# Patient Record
Sex: Female | Born: 1950 | Race: Black or African American | Hispanic: No | Marital: Single | Smoking: Never smoker
Health system: Southern US, Community
[De-identification: ages and names within clinical notes are randomized; demographics above are authoritative.]

## PROBLEM LIST (undated history)

## (undated) DIAGNOSIS — E039 Hypothyroidism, unspecified: Secondary | ICD-10-CM

## (undated) DIAGNOSIS — E785 Hyperlipidemia, unspecified: Secondary | ICD-10-CM

## (undated) DIAGNOSIS — M109 Gout, unspecified: Secondary | ICD-10-CM

## (undated) DIAGNOSIS — M1990 Unspecified osteoarthritis, unspecified site: Secondary | ICD-10-CM

## (undated) DIAGNOSIS — I10 Essential (primary) hypertension: Secondary | ICD-10-CM

## (undated) DIAGNOSIS — E119 Type 2 diabetes mellitus without complications: Secondary | ICD-10-CM

## (undated) HISTORY — PX: BACK SURGERY: SHX140

## (undated) HISTORY — PX: JOINT REPLACEMENT: SHX530

## (undated) HISTORY — PX: TONSILLECTOMY: SUR1361

## (undated) HISTORY — PX: ABDOMINAL HYSTERECTOMY: SHX81

---

## 2010-06-18 ENCOUNTER — Emergency Department (HOSPITAL_BASED_OUTPATIENT_CLINIC_OR_DEPARTMENT_OTHER)
Admission: EM | Admit: 2010-06-18 | Discharge: 2010-06-18 | Payer: Self-pay | Source: Home / Self Care | Admitting: Emergency Medicine

## 2013-02-11 ENCOUNTER — Encounter (HOSPITAL_BASED_OUTPATIENT_CLINIC_OR_DEPARTMENT_OTHER): Payer: Self-pay | Admitting: *Deleted

## 2013-02-11 ENCOUNTER — Emergency Department (HOSPITAL_BASED_OUTPATIENT_CLINIC_OR_DEPARTMENT_OTHER)
Admission: EM | Admit: 2013-02-11 | Discharge: 2013-02-11 | Disposition: A | Discharge status: Home / Self Care | Payer: BC Managed Care – PPO | Attending: Emergency Medicine | Admitting: Emergency Medicine

## 2013-02-11 ENCOUNTER — Emergency Department (HOSPITAL_BASED_OUTPATIENT_CLINIC_OR_DEPARTMENT_OTHER): Payer: BC Managed Care – PPO

## 2013-02-11 DIAGNOSIS — I10 Essential (primary) hypertension: Secondary | ICD-10-CM | POA: Insufficient documentation

## 2013-02-11 DIAGNOSIS — M10472 Other secondary gout, left ankle and foot: Secondary | ICD-10-CM

## 2013-02-11 DIAGNOSIS — E785 Hyperlipidemia, unspecified: Secondary | ICD-10-CM | POA: Insufficient documentation

## 2013-02-11 DIAGNOSIS — Z794 Long term (current) use of insulin: Secondary | ICD-10-CM | POA: Insufficient documentation

## 2013-02-11 DIAGNOSIS — M109 Gout, unspecified: Secondary | ICD-10-CM | POA: Insufficient documentation

## 2013-02-11 DIAGNOSIS — Z79899 Other long term (current) drug therapy: Secondary | ICD-10-CM | POA: Insufficient documentation

## 2013-02-11 DIAGNOSIS — E119 Type 2 diabetes mellitus without complications: Secondary | ICD-10-CM | POA: Insufficient documentation

## 2013-02-11 HISTORY — DX: Type 2 diabetes mellitus without complications: E11.9

## 2013-02-11 HISTORY — DX: Hyperlipidemia, unspecified: E78.5

## 2013-02-11 HISTORY — DX: Essential (primary) hypertension: I10

## 2013-02-11 HISTORY — DX: Gout, unspecified: M10.9

## 2013-02-11 MED ORDER — HYDROCODONE-ACETAMINOPHEN 5-325 MG PO TABS: 2.0000 | ORAL_TABLET | ORAL | Status: DC | PRN

## 2013-02-11 MED ORDER — INDOMETHACIN 25 MG PO CAPS: 25.0000 mg | ORAL_CAPSULE | Freq: Three times a day (TID) | ORAL | Status: DC | PRN

## 2013-02-11 MED ORDER — HYDROCODONE-ACETAMINOPHEN 5-325 MG PO TABS
2.0000 | ORAL_TABLET | Freq: Once | ORAL | Status: AC
  Administered 2013-02-11: 2 via ORAL
  Filled 2013-02-11: qty 2

## 2013-02-11 NOTE — ED Notes (Signed)
Pt c/o left foot pain x 3 days HX of gout

## 2013-02-11 NOTE — ED Notes (Signed)
Pt returned from xray

## 2013-02-11 NOTE — ED Notes (Signed)
Patient transported to X-ray via WC. 

## 2013-02-11 NOTE — ED Provider Notes (Signed)
Medical screening examination/treatment/procedure(s) were performed by non-physician practitioner and as supervising physician I was immediately available for consultation/collaboration.  Judithe Keetch, MD 02/11/13 2220 

## 2013-02-11 NOTE — ED Provider Notes (Signed)
CSN: [id number]     Arrival date & time 02/11/13  1855 History   First MD Initiated Contact with Patient 02/11/13 1919     Chief Complaint  Patient presents with  . Foot Pain   (Consider location/radiation/quality/duration/timing/severity/associated sxs/prior Treatment) Patient is a 62 y.o. female presenting with ankle pain. The history is provided by the patient. No language interpreter was used.  Ankle Pain Location:  Ankle Time since incident:  3 days Injury: no   Ankle location:  L ankle Pain details:    Quality:  Aching   Radiates to:  Does not radiate   Severity:  Severe   Onset quality:  Gradual   Duration:  3 days   Timing:  Constant   Progression:  Worsening Dislocation: no   Foreign body present:  No foreign bodies Prior injury to area:  No Worsened by:  Nothing tried Ineffective treatments:  None tried Pt reports she has a history of gout.  Pt reports increasing pain.   Past Medical History  Diagnosis Date  . Gout   . Hypertension   . Diabetes mellitus without complication   . Hyperlipemia    Past Surgical History  Procedure Laterality Date  . Abdominal hysterectomy    . Tonsillectomy    . Back surgery    . Joint replacement     History reviewed. No pertinent family history. History  Substance Use Topics  . Smoking status: Never Smoker   . Smokeless tobacco: Not on file  . Alcohol Use: No   OB History   Grav Para Term Preterm Abortions TAB SAB Ect Mult Living                 Review of Systems  Musculoskeletal: Positive for myalgias and joint swelling.  All other systems reviewed and are negative.    Allergies  Aspirin  Home Medications   Current Outpatient Rx  Name  Route  Sig  Dispense  Refill  . amLODipine-valsartan (EXFORGE) 5-160 MG per tablet   Oral   Take 1 tablet by mouth daily.         . insulin glargine (LANTUS) 100 UNIT/ML injection   Subcutaneous   Inject 70 Units into the skin daily.         Marland Kitchen levothyroxine  (SYNTHROID, LEVOTHROID) 125 MCG tablet   Oral   Take 125 mcg by mouth daily before breakfast.         . metFORMIN (GLUCOPHAGE) 500 MG tablet   Oral   Take 500 mg by mouth 2 (two) times daily with a meal.         . metoprolol tartrate (LOPRESSOR) 25 MG tablet   Oral   Take 25 mg by mouth 2 (two) times daily.         . rosuvastatin (CRESTOR) 10 MG tablet   Oral   Take 10 mg by mouth daily.          BP 170/70  Pulse 86  Temp(Src) 98.7 F (37.1 C) (Oral)  Resp 18  Ht 5\' 6"  (1.676 m)  Wt 250 lb (113.399 kg)  BMI 40.37 kg/m2  SpO2 92% Physical Exam  Nursing note and vitals reviewed. Constitutional: She is oriented to person, place, and time. She appears well-developed and well-nourished.  HENT:  Head: Normocephalic.  Musculoskeletal: She exhibits tenderness.  Swollen tender ankle,  From painful,    Neurological: She is alert and oriented to person, place, and time. She has normal reflexes.  Skin: Skin is  warm.  Psychiatric: She has a normal mood and affect.    ED Course  Procedures (including critical care time) Labs Review Labs Reviewed - No data to display Imaging Review Dg Ankle Complete Left  02/11/2013   CLINICAL DATA:  Left ankle pain for 3 days, history of gout  EXAM: LEFT ANKLE COMPLETE - 3+ VIEW  COMPARISON:  None.  FINDINGS: Three views of left ankle submitted. No acute fracture or subluxation. There is soft tissue swelling adjacent to medial and lateral malleolus. Ankle mortise is preserved. Plantar spur of calcaneus is noted. Mild dorsal spurring anterior aspect of the talus. There is well corticated small calcification adjacent to distal tibia medial malleolus. This most likely is due to prior injury.  IMPRESSION: No acute fracture or subluxation. There is soft tissue swelling adjacent to medial and lateral malleolus. Ankle mortise is preserved. Plantar spur of calcaneus is noted. Mild dorsal spurring anterior aspect of the talus. There is well corticated  small calcification adjacent to distal tibia medial malleolus. This most likely is due to prior injury.   Electronically Signed   By: Natasha Mead   On: 02/11/2013 20:24    MDM   1. Other secondary gout, left ankle and foot    Pt given 2 hydrocodone here.   Rx for hydrocodone and indocin.  Pt advised to see her MD for recheck   Elson Areas, PA-C 02/11/13 2101

## 2017-09-15 ENCOUNTER — Encounter (HOSPITAL_BASED_OUTPATIENT_CLINIC_OR_DEPARTMENT_OTHER): Payer: Self-pay | Admitting: Emergency Medicine

## 2017-09-15 ENCOUNTER — Emergency Department (HOSPITAL_BASED_OUTPATIENT_CLINIC_OR_DEPARTMENT_OTHER)
Admission: EM | Admit: 2017-09-15 | Discharge: 2017-09-15 | Disposition: A | Discharge status: Home / Self Care | Payer: Medicare Other | Attending: Emergency Medicine | Admitting: Emergency Medicine

## 2017-09-15 ENCOUNTER — Other Ambulatory Visit: Payer: Self-pay

## 2017-09-15 DIAGNOSIS — R42 Dizziness and giddiness: Secondary | ICD-10-CM | POA: Diagnosis not present

## 2017-09-15 DIAGNOSIS — R61 Generalized hyperhidrosis: Secondary | ICD-10-CM | POA: Insufficient documentation

## 2017-09-15 DIAGNOSIS — K591 Functional diarrhea: Secondary | ICD-10-CM | POA: Insufficient documentation

## 2017-09-15 DIAGNOSIS — R6883 Chills (without fever): Secondary | ICD-10-CM | POA: Insufficient documentation

## 2017-09-15 DIAGNOSIS — I10 Essential (primary) hypertension: Secondary | ICD-10-CM | POA: Insufficient documentation

## 2017-09-15 DIAGNOSIS — E119 Type 2 diabetes mellitus without complications: Secondary | ICD-10-CM | POA: Diagnosis not present

## 2017-09-15 DIAGNOSIS — Z794 Long term (current) use of insulin: Secondary | ICD-10-CM | POA: Diagnosis not present

## 2017-09-15 DIAGNOSIS — Z79899 Other long term (current) drug therapy: Secondary | ICD-10-CM | POA: Diagnosis not present

## 2017-09-15 DIAGNOSIS — R197 Diarrhea, unspecified: Secondary | ICD-10-CM | POA: Diagnosis present

## 2017-09-15 LAB — COMPREHENSIVE METABOLIC PANEL
ALBUMIN: 3.5 g/dL (ref 3.5–5.0)
ALT: 13 U/L — ABNORMAL LOW (ref 14–54)
ANION GAP: 9 (ref 5–15)
AST: 21 U/L (ref 15–41)
Alkaline Phosphatase: 51 U/L (ref 38–126)
BUN: 22 mg/dL — ABNORMAL HIGH (ref 6–20)
CO2: 25 mmol/L (ref 22–32)
Calcium: 9.3 mg/dL (ref 8.9–10.3)
Chloride: 105 mmol/L (ref 101–111)
Creatinine, Ser: 0.86 mg/dL (ref 0.44–1.00)
GFR calc Af Amer: >60 mL/min (ref >60)
GFR calc non Af Amer: >60 mL/min (ref >60)
Glucose, Bld: 110 mg/dL — ABNORMAL HIGH (ref 65–99)
POTASSIUM: 3.9 mmol/L (ref 3.5–5.1)
Sodium: 139 mmol/L (ref 135–145)
Total Bilirubin: 0.4 mg/dL (ref 0.3–1.2)
Total Protein: 7 g/dL (ref 6.5–8.1)

## 2017-09-15 LAB — CBC WITH DIFFERENTIAL/PLATELET
Basophils Absolute: 0 10*3/uL (ref 0.0–0.1)
Basophils Relative: 0 %
Eosinophils Absolute: 0.4 10*3/uL (ref 0.0–0.7)
Eosinophils Relative: 4 %
HCT: 38.6 % (ref 36.0–46.0)
Hemoglobin: 12.8 g/dL (ref 12.0–15.0)
LYMPHS PCT: 20 %
Lymphs Abs: 2 10*3/uL (ref 0.7–4.0)
MCH: 30.4 pg (ref 26.0–34.0)
MCHC: 33.2 g/dL (ref 30.0–36.0)
MCV: 91.7 fL (ref 78.0–100.0)
MONO ABS: 0.9 10*3/uL (ref 0.1–1.0)
MONOS PCT: 9 %
NEUTROS ABS: 6.4 10*3/uL (ref 1.7–7.7)
Neutrophils Relative %: 67 %
Platelets: 292 10*3/uL (ref 150–400)
RBC: 4.21 MIL/uL (ref 3.87–5.11)
RDW: 14.5 % (ref 11.5–15.5)
WBC: 9.6 10*3/uL (ref 4.0–10.5)

## 2017-09-15 LAB — URINALYSIS, ROUTINE W REFLEX MICROSCOPIC
Bilirubin Urine: NEGATIVE
Glucose, UA: NEGATIVE mg/dL
Ketones, ur: 15 mg/dL — AB
LEUKOCYTES UA: NEGATIVE
Nitrite: NEGATIVE
PH: 5 (ref 5.0–8.0)
Protein, ur: NEGATIVE mg/dL
Specific Gravity, Urine: >1.03 — ABNORMAL HIGH (ref 1.005–1.030)

## 2017-09-15 LAB — LIPASE, BLOOD: Lipase: 28 U/L (ref 11–51)

## 2017-09-15 LAB — URINALYSIS, MICROSCOPIC (REFLEX)

## 2017-09-15 LAB — I-STAT CG4 LACTIC ACID, ED: Lactic Acid, Venous: 1.12 mmol/L (ref 0.5–1.9)

## 2017-09-15 LAB — CBG MONITORING, ED: Glucose-Capillary: 87 mg/dL (ref 65–99)

## 2017-09-15 MED ORDER — SODIUM CHLORIDE 0.9 % IV BOLUS
1000.0000 mL | Freq: Once | INTRAVENOUS | Status: AC
  Administered 2017-09-15: 1000 mL via INTRAVENOUS

## 2017-09-15 MED ORDER — LOPERAMIDE HCL 2 MG PO CAPS: 2.0000 mg | ORAL_CAPSULE | Freq: Four times a day (QID) | ORAL | 0 refills | Status: DC | PRN

## 2017-09-15 NOTE — Discharge Instructions (Signed)
° °  Hand washing: Wash your hands throughout the day, but especially before and after touching the face, using the restroom, sneezing, coughing, or touching surfaces that have been coughed or sneezed upon. Hydration: Symptoms will be intensified and complicated by dehydration. Dehydration can also extend the duration of symptoms. Drink plenty of fluids and get plenty of rest. You should be drinking at least half a liter of water an hour to stay hydrated. Electrolyte drinks (ex. Gatorade, Powerade, Pedialyte) are also encouraged. You should be drinking enough fluids to make your urine light yellow, almost clear. If this is not the case, you are not drinking enough water. Please note that some of the treatments indicated below will not be effective if you are not adequately hydrated. Diet: Please concentrate on hydration, however, you may introduce food slowly.  Start with a clear liquid diet, progressed to a full liquid diet, and then bland solids as you are able. Pain or fever: Ibuprofen, Naproxen, or Tylenol for pain or fever.  Diarrhea: May take Imodium, as prescribed, for diarrhea.  Avoid foods with dairy while having diarrhea. Follow up: Follow up with a primary care provider, as needed, for any future management of this issue. Return: Return to the ED should symptoms worsen or you develop fever, blood in the stool, abdominal pain, persistent vomiting, or any other major concerns.

## 2017-09-15 NOTE — ED Triage Notes (Signed)
Patient states that she has had adbominal pain, chills and diarrhea for the last 2 days  - today she started to feel weak all over and dizzy

## 2017-09-15 NOTE — ED Notes (Signed)
Pt given rx x 1 for imodium at d/c. D/c home with her son driving

## 2017-09-15 NOTE — ED Notes (Signed)
Pt's CBG 87 reported to [place], [place]. Pt given OJ with verbal approval

## 2017-09-15 NOTE — ED Provider Notes (Signed)
[facility] EMERGENCY DEPARTMENT Provider Note   CSN: [id number] Arrival date & time: 09/15/17  1422     History   Chief Complaint Chief Complaint  Patient presents with  . Diarrhea    HPI Gina Rivas is a 67 y.o. female.  HPI   Gina Rivas is a 67 y.o. female, with a history of DM, hyperlipidemia, and HTN, presenting to the ED with diarrhea for last two days. Endorses stools throughout the day. States, "Too many instances to count." Accompanied by chills, sweats, and lightheadedness.  Last BM prior to arrival. No recent ABX. No recent hospitalizations. No known sick contacts.  Denies known fever, vomiting, hematochezia/melena, abdominal pain, shortness of breath, chest pain, or any other complaints.     Past Medical History:  Diagnosis Date  . Diabetes mellitus without complication (HCC)   . Gout   . Hyperlipemia   . Hypertension     There are no active problems to display for this patient.   Past Surgical History:  Procedure Laterality Date  . ABDOMINAL HYSTERECTOMY    . BACK SURGERY    . JOINT REPLACEMENT    . TONSILLECTOMY       OB History   None      Home Medications    Prior to Admission medications   Medication Sig Start Date End Date Taking? Authorizing Provider  amLODipine-valsartan (EXFORGE) 5-160 MG per tablet Take 1 tablet by mouth daily.   Yes [provider]  insulin glargine (LANTUS) 100 UNIT/ML injection Inject 70 Units into the skin daily.   Yes [provider]  levothyroxine (SYNTHROID, LEVOTHROID) 125 MCG tablet Take 125 mcg by mouth daily before breakfast.   Yes [provider]  metFORMIN (GLUCOPHAGE) 500 MG tablet Take 500 mg by mouth 2 (two) times daily with a meal.   Yes [provider]  metoprolol tartrate (LOPRESSOR) 25 MG tablet Take 25 mg by mouth 2 (two) times daily.   Yes [provider]  rosuvastatin (CRESTOR) 10 MG tablet Take 10 mg by mouth daily.   Yes  [provider]  HYDROcodone-acetaminophen (NORCO/VICODIN) 5-325 MG per tablet Take 2 tablets by mouth every 4 (four) hours as needed. 02/11/13   Elson Areas, PA-C  indomethacin (INDOCIN) 25 MG capsule Take 1 capsule (25 mg total) by mouth 3 (three) times daily as needed. 02/11/13   Elson Areas, PA-C  loperamide (IMODIUM) 2 MG capsule Take 1 capsule (2 mg total) by mouth 4 (four) times daily as needed for diarrhea or loose stools. 09/15/17   Anselm Pancoast, PA-C    Family History History reviewed. No pertinent family history.  Social History Social History   Tobacco Use  . Smoking status: Never Smoker  . Smokeless tobacco: Never Used  Substance Use Topics  . Alcohol use: No  . Drug use: No     Allergies   Aspirin   Review of Systems Review of Systems  Constitutional: Negative for chills, diaphoresis and fever.  Respiratory: Negative for shortness of breath.   Cardiovascular: Negative for chest pain.  Gastrointestinal: Positive for diarrhea. Negative for abdominal pain, blood in stool, nausea and vomiting.  Genitourinary: Negative for dysuria, flank pain, frequency and hematuria.  Musculoskeletal: Negative for back pain.  Skin: Negative for color change.  All other systems reviewed and are negative.    Physical Exam Updated Vital Signs BP 114/72 (BP Location: Right Arm)   Pulse 86   Temp 98.3 F (36.8 C) (Oral)  Resp 16   Ht  (1.676 m)   Wt 104.3 kg (230 lb)   SpO2 96%   BMI 37.12 kg/m   Physical Exam  Constitutional: She appears well-developed and well-nourished. No distress.  HENT:  Head: Normocephalic and atraumatic.  Eyes: Conjunctivae are normal.  Neck: Neck supple.  Cardiovascular: Normal rate, regular rhythm, normal heart sounds and intact distal pulses.  Pulmonary/Chest: Effort normal and breath sounds normal. No respiratory distress.  Abdominal: Soft. Bowel sounds are increased. There is no tenderness. There is no guarding.    Musculoskeletal: She exhibits no edema.  Lymphadenopathy:    She has no cervical adenopathy.  Neurological: She is alert.  Skin: Skin is warm and dry. She is not diaphoretic.  Psychiatric: She has a normal mood and affect. Her behavior is normal.  Nursing note and vitals reviewed.    ED Treatments / Results  Labs (all labs ordered are listed, but only abnormal results are displayed) Labs Reviewed  COMPREHENSIVE METABOLIC PANEL - Abnormal; Notable for the following components:      Result Value   Glucose, Bld 110 (*)    BUN 22 (*)    ALT 13 (*)    All other components within normal limits  URINALYSIS, ROUTINE W REFLEX MICROSCOPIC - Abnormal; Notable for the following components:   Specific Gravity, Urine >1.030 (*)    Hgb urine dipstick TRACE (*)    Ketones, ur 15 (*)    All other components within normal limits  URINALYSIS, MICROSCOPIC (REFLEX) - Abnormal; Notable for the following components:   Bacteria, UA MANY (*)    All other components within normal limits  CBC WITH DIFFERENTIAL/PLATELET  LIPASE, BLOOD  I-STAT CG4 LACTIC ACID, ED  CBG MONITORING, ED    EKG None  Radiology No results found.  Procedures Procedures (including critical care time)  Medications Ordered in ED Medications  sodium chloride 0.9 % bolus 1,000 mL (0 mLs Intravenous Stopped 09/15/17 1951)     Initial Impression / Assessment and Plan / ED Course  I have reviewed the triage vital signs and the nursing notes.  Pertinent labs & imaging results that were available during my care of the patient were reviewed by me and considered in my medical decision making (see chart for details).  Clinical Course as of Sep 17 115  Sun Sep 15, 2017  9604 Patient reassessed.  Appears to be resting comfortably.  Continues to deny pain or other current complaints.   [SJ]    Clinical Course User Index [SJ] Nader Boys C, PA-C    Patient presents with diarrhea for the last 2 days.  Abdominal exam  benign. Patient is nontoxic appearing, afebrile, not tachycardic, not tachypneic, not hypotensive, maintains adequate SPO2 on room air, and is in no apparent distress.  Lab results reassuring. No bowel movements during ED course.  PCP follow-up as needed. The patient was given instructions for home care as well as return precautions. Patient voices understanding of these instructions, accepts the plan, and is comfortable with discharge.    Findings and plan of care discussed with Doug Sou, MD. Dr. Ethelda Chick personally evaluated and examined this patient.  Vitals:   09/15/17 1441 09/15/17 1716 09/15/17 1925  BP: 114/72 115/66 116/62  Pulse: 86 68 72  Resp: Temp: 98.3 F (36.8 C)    TempSrc: Oral    SpO2: 96% 96% 96%  Weight: 104.3 kg (230 lb)    Height:  (1.676 m)  Final Clinical Impressions(s) / ED Diagnoses   Final diagnoses:  Functional diarrhea    ED Discharge Orders        Ordered    loperamide (IMODIUM) 2 MG capsule  4 times daily PRN     09/15/17 1937       Anselm Pancoast, PA-C 09/16/17 0118    Doug Sou, MD 09/16/17 1225

## 2017-09-15 NOTE — ED Provider Notes (Signed)
She complains of diarrhea and generalized weakness onset 3 days ago.  No fever no abdominal pain no vomiting lightheadedness worse with standing.  On exam she is in no distress lungs clear to auscultation heart regular rate and rhythm abdomen obese, normal active bowel sounds, nontender   Doug Sou, MD 09/15/17 [phone]

## 2019-09-16 NOTE — Progress Notes (Signed)
Need orders in epic .  Surgery on 09/23/19.

## 2019-09-16 NOTE — Progress Notes (Signed)
DUE TO COVID-19 ONLY ONE VISITOR IS ALLOWED TO COME WITH YOU AND STAY IN THE WAITING ROOM ONLY DURING PRE OP AND PROCEDURE DAY OF SURGERY. THE 1 VISITOR MAY VISIT WITH YOU AFTER SURGERY IN YOUR PRIVATE ROOM DURING VISITING HOURS ONLY!  YOU NEED TO HAVE A COVID 19 TEST ON_5/1/21 ______ @__1100am_____ , THIS TEST MUST BE DONE BEFORE SURGERY, COME  [address], [place] , .  [facility]) ONCE YOUR COVID TEST IS COMPLETED, PLEASE BEGIN THE QUARANTINE INSTRUCTIONS AS OUTLINED IN YOUR HANDOUT.                Gina Rivas  09/16/2019   Your procedure is scheduled on:  09/23/2019   Report to [facility] Main  Entrance   Report to admitting at    0930 AM     Call this number if you have problems the morning of surgery [phone]    Remember: Do not eat food or drink liquids :After Midnight. BRUSH YOUR TEETH MORNING OF SURGERY AND RINSE YOUR MOUTH OUT, NO CHEWING GUM CANDY OR MINTS.     Take these medicines the morning of surgery with A SIP OF WATER:  Amlodipine, Labetalol, Synthroid  DO NOT TAKE ANY DIABETIC MEDICATIONS DAY OF YOUR SURGERY    Take 1/2 dose of Levemir insulin  nite before surgery                            You may not have any metal on your body including hair pins and              piercings  Do not wear jewelry, make-up, lotions, powders or perfumes, deodorant             Do not wear nail polish on your fingernails.  Do not shave  48 hours prior to surgery.              Do not bring valuables to the hospital. [place] IS NOT             RESPONSIBLE   FOR VALUABLES.  Contacts, dentures or bridgework may not be worn into surgery.  Leave suitcase in the car. After surgery it may be brought to your room.     Patients discharged the day of surgery will not be allowed to drive home. IF YOU ARE HAVING SURGERY AND GOING HOME THE SAME DAY, YOU MUST HAVE AN ADULT TO DRIVE YOU HOME AND BE WITH YOU FOR 24 HOURS. YOU MAY GO HOME BY TAXI OR  UBER OR ORTHERWISE, BUT AN ADULT MUST ACCOMPANY YOU HOME AND STAY WITH YOU FOR 24 HOURS.  Name and phone number of your driver:  Special Instructions: N/A              Please read over the following fact sheets you were given: _____________________________________________________________________             [facility] - Preparing for Surgery Before surgery, you can play an important role.  Because skin is not sterile, your skin needs to be as free of germs as possible.  You can reduce the number of germs on your skin by washing with CHG (chlorahexidine gluconate) soap before surgery.  CHG is an antiseptic cleaner which kills germs and bonds with the skin to continue killing germs even after washing. Please DO NOT use if you have an allergy to CHG or antibacterial soaps.  If your  skin becomes reddened/irritated stop using the CHG and inform your nurse when you arrive at Short Stay. Do not shave (including legs and underarms) for at least 48 hours prior to the first CHG shower.  You may shave your face/neck. Please follow these instructions carefully:  1.  Shower with CHG Soap the night before surgery and the  morning of Surgery.  2.  If you choose to wash your hair, wash your hair first as usual with your  normal  shampoo.  3.  After you shampoo, rinse your hair and body thoroughly to remove the  shampoo.                           4.  Use CHG as you would any other liquid soap.  You can apply chg directly  to the skin and wash                       Gently with a scrungie or clean washcloth.  5.  Apply the CHG Soap to your body ONLY FROM THE NECK DOWN.   Do not use on face/ open                           Wound or open sores. Avoid contact with eyes, ears mouth and genitals (private parts).                       Wash face,  Genitals (private parts) with your normal soap.             6.  Wash thoroughly, paying special attention to the area where your surgery  will be performed.  7.  Thoroughly  rinse your body with warm water from the neck down.  8.  DO NOT shower/wash with your normal soap after using and rinsing off  the CHG Soap.                9.  Pat yourself dry with a clean towel.            10.  Wear clean pajamas.            11.  Place clean sheets on your bed the night of your first shower and do not  sleep with pets. Day of Surgery : Do not apply any lotions/deodorants the morning of surgery.  Please wear clean clothes to the hospital/surgery center.  FAILURE TO FOLLOW THESE INSTRUCTIONS MAY RESULT IN THE CANCELLATION OF YOUR SURGERY PATIENT SIGNATURE_________________________________  NURSE SIGNATURE__________________________________  ________________________________________________________________________

## 2019-09-17 ENCOUNTER — Other Ambulatory Visit: Payer: Self-pay

## 2019-09-17 ENCOUNTER — Encounter (HOSPITAL_COMMUNITY)
Admission: RE | Admit: 2019-09-17 | Discharge: 2019-09-17 | Disposition: A | Discharge status: Home / Self Care | Payer: Medicare PPO | Source: Ambulatory Visit | Attending: Orthopedic Surgery | Admitting: Orthopedic Surgery

## 2019-09-17 ENCOUNTER — Encounter (HOSPITAL_COMMUNITY): Payer: Self-pay

## 2019-09-17 ENCOUNTER — Ambulatory Visit: Payer: Self-pay | Admitting: Orthopedic Surgery

## 2019-09-17 DIAGNOSIS — E785 Hyperlipidemia, unspecified: Secondary | ICD-10-CM | POA: Insufficient documentation

## 2019-09-17 DIAGNOSIS — E119 Type 2 diabetes mellitus without complications: Secondary | ICD-10-CM | POA: Insufficient documentation

## 2019-09-17 DIAGNOSIS — Z79899 Other long term (current) drug therapy: Secondary | ICD-10-CM | POA: Diagnosis not present

## 2019-09-17 DIAGNOSIS — I10 Essential (primary) hypertension: Secondary | ICD-10-CM | POA: Diagnosis not present

## 2019-09-17 DIAGNOSIS — Z01818 Encounter for other preprocedural examination: Secondary | ICD-10-CM | POA: Insufficient documentation

## 2019-09-17 DIAGNOSIS — R9431 Abnormal electrocardiogram [ECG] [EKG]: Secondary | ICD-10-CM | POA: Diagnosis not present

## 2019-09-17 DIAGNOSIS — Z79890 Hormone replacement therapy (postmenopausal): Secondary | ICD-10-CM | POA: Insufficient documentation

## 2019-09-17 DIAGNOSIS — E039 Hypothyroidism, unspecified: Secondary | ICD-10-CM | POA: Insufficient documentation

## 2019-09-17 DIAGNOSIS — M1612 Unilateral primary osteoarthritis, left hip: Secondary | ICD-10-CM | POA: Diagnosis not present

## 2019-09-17 DIAGNOSIS — Z794 Long term (current) use of insulin: Secondary | ICD-10-CM | POA: Insufficient documentation

## 2019-09-17 HISTORY — DX: Unspecified osteoarthritis, unspecified site: M19.90

## 2019-09-17 HISTORY — DX: Hypothyroidism, unspecified: E03.9

## 2019-09-17 NOTE — Patient Instructions (Addendum)
DUE TO COVID-19 ONLY ONE VISITOR IS ALLOWED TO COME WITH YOU AND STAY IN THE WAITING ROOM ONLY DURING PRE OP AND PROCEDURE DAY OF SURGERY. THE 1 VISITOR MAY VISIT WITH YOU AFTER SURGERY IN YOUR PRIVATE ROOM DURING VISITING HOURS ONLY!  YOU NEED TO HAVE A COVID 19 TEST ON:09/19/19  @ 11:00 , THIS TEST MUST BE DONE BEFORE SURGERY, COME  [place], [address] [place] , [address].  ([place]) ONCE YOUR COVID TEST IS COMPLETED, PLEASE BEGIN THE QUARANTINE INSTRUCTIONS AS OUTLINED IN YOUR HANDOUT.                Trysten Berti    Your procedure is scheduled on: 09/23/19   Report to [facility] Main  Entrance   Report to admitting at: 9:30 AM     Call this number if you have problems the morning of surgery [phone]    Remember:    [place], NO [place].     Take these medicines the morning of surgery with A SIP OF WATER: amlodipine,labetelol,synthroid. How to Manage Your Diabetes Before and After Surgery  Why is it important to control my blood sugar before and after surgery? . Improving blood sugar levels before and after surgery helps healing and can limit problems. . A way of improving blood sugar control is eating a healthy diet by: o  Eating less sugar and carbohydrates o  Increasing activity/exercise o  Talking with your doctor about reaching your blood sugar goals . High blood sugars (greater than 180 mg/dL) can raise your risk of infections and slow your recovery, so you will need to focus on controlling your diabetes during the weeks before surgery. . Make sure that the doctor who takes care of your diabetes knows about your planned surgery including the date and location.  How do I manage my blood sugar before surgery? . Check your blood sugar at least 4 times a day, starting 2 days before surgery, to make sure that the level is not too high or low. o Check your blood sugar the  morning of your surgery when you wake up and every 2 hours until you get to the Short Stay unit. . If your blood sugar is less than 70 mg/dL, you will need to treat for low blood sugar: o Do not take insulin. o Treat a low blood sugar (less than 70 mg/dL) with  cup of clear juice (cranberry or apple), 4 glucose tablets, OR glucose gel. o Recheck blood sugar in 15 minutes after treatment (to make sure it is greater than 70 mg/dL). If your blood sugar is not greater than 70 mg/dL on recheck, call [phone] for further instructions. . Report your blood sugar to the short stay nurse when you get to Short Stay.  . If you are admitted to the hospital after surgery: o Your blood sugar will be checked by the staff and you will probably be given insulin after surgery (instead of oral diabetes medicines) to make sure you have good blood sugar levels. o The goal for blood sugar control after surgery is 80-180 mg/dL.   WHAT DO I DO ABOUT MY DIABETES MEDICATION?  Marland Kitchen Do not take oral diabetes medicines (pills) the morning of surgery.  . THE NIGHT BEFORE SURGERY, take half of Levemir insulin.        DO NOT TAKE ANY DIABETIC MEDICATIONS DAY OF YOUR SURGERY  You may not have any metal on your body including hair pins and              piercings  Do not wear jewelry, make-up, lotions, powders or perfumes, deodorant             Do not wear nail polish on your fingernails.  Do not shave  48 hours prior to surgery.    Do not bring valuables to the hospital.  IS NOT             RESPONSIBLE   FOR VALUABLES.  Contacts, dentures or bridgework may not be worn into surgery.  Leave suitcase in the car. After surgery it may be brought to your room.     Patients discharged the day of surgery will not be allowed to drive home. IF YOU ARE HAVING SURGERY AND GOING HOME THE SAME DAY, YOU MUST HAVE AN ADULT TO DRIVE YOU HOME AND BE WITH YOU FOR 24 HOURS. YOU MAY GO HOME BY TAXI  OR UBER OR ORTHERWISE, BUT AN ADULT MUST ACCOMPANY YOU HOME AND STAY WITH YOU FOR 24 HOURS.  Name and phone number of your driver:  Special Instructions: N/A              Please read over the following fact sheets you were given: _____________________________________________________________________     NO SOLID FOOD AFTER MIDNIGHT THE NIGHT PRIOR TO SURGERY. NOTHING BY MOUTH EXCEPT CLEAR LIQUIDS UNTIL: 9:00 am . PLEASE FINISH GATORADE DRINK PER SURGEON ORDER  WHICH NEEDS TO BE COMPLETED AT: 9:00 am .   CLEAR LIQUID DIET   Foods Allowed                                                                     Foods Excluded  Coffee and tea, regular and decaf                             liquids that you cannot  Plain Jell-O any favor except red or purple                                           see through such as: Fruit ices (not with fruit pulp)                                     milk, soups, orange juice  Iced Popsicles                                    All solid food Carbonated beverages, regular and diet                                    Cranberry, grape and apple juices Sports drinks like Gatorade Lightly seasoned clear broth or consume(fat free) Sugar, honey syrup  Sample Menu Breakfast  Lunch                                     Supper Cranberry juice                    Beef broth                            Chicken broth Jell-O                                     Grape juice                           Apple juice Coffee or tea                        Jell-O                                      Popsicle                                                Coffee or tea                        Coffee or tea  _____________________________________________________________________  [facility] - Preparing for Surgery Before surgery, you can play an important role.  Because skin is not sterile, your skin needs to be as free of germs as possible.  You can reduce  the number of germs on your skin by washing with CHG (chlorahexidine gluconate) soap before surgery.  CHG is an antiseptic cleaner which kills germs and bonds with the skin to continue killing germs even after washing. Please DO NOT use if you have an allergy to CHG or antibacterial soaps.  If your skin becomes reddened/irritated stop using the CHG and inform your nurse when you arrive at Short Stay. Do not shave (including legs and underarms) for at least 48 hours prior to the first CHG shower.  You may shave your face/neck. Please follow these instructions carefully:  1.  Shower with CHG Soap the night before surgery and the  morning of Surgery.  2.  If you choose to wash your hair, wash your hair first as usual with your  normal  shampoo.  3.  After you shampoo, rinse your hair and body thoroughly to remove the  shampoo.                           4.  Use CHG as you would any other liquid soap.  You can apply chg directly  to the skin and wash                       Gently with a scrungie or clean washcloth.  5.  Apply the CHG Soap to your body ONLY FROM THE NECK DOWN.   Do not use on face/ open  Wound or open sores. Avoid contact with eyes, ears mouth and genitals (private parts).                       Wash face,  Genitals (private parts) with your normal soap.             6.  Wash thoroughly, paying special attention to the area where your surgery  will be performed.  7.  Thoroughly rinse your body with warm water from the neck down.  8.  DO NOT shower/wash with your normal soap after using and rinsing off  the CHG Soap.                9.  Pat yourself dry with a clean towel.            10.  Wear clean pajamas.            11.  Place clean sheets on your bed the night of your first shower and do not  sleep with pets. Day of Surgery : Do not apply any lotions/deodorants the morning of surgery.  Please wear clean clothes to the hospital/surgery center.  FAILURE TO FOLLOW  THESE INSTRUCTIONS MAY RESULT IN THE CANCELLATION OF YOUR SURGERY PATIENT SIGNATURE_________________________________  NURSE SIGNATURE__________________________________  ________________________________________________________________________   Rogelia Mire  An incentive spirometer is a tool that can help keep your lungs clear and active. This tool measures how well you are filling your lungs with each breath. Taking long deep breaths may help reverse or decrease the chance of developing breathing (pulmonary) problems (especially infection) following:  A long period of time when you are unable to move or be active. BEFORE THE PROCEDURE   If the spirometer includes an indicator to show your best effort, your nurse or respiratory therapist will set it to a desired goal.  If possible, sit up straight or lean slightly forward. Try not to slouch.  Hold the incentive spirometer in an upright position. INSTRUCTIONS FOR USE  1. Sit on the edge of your bed if possible, or sit up as far as you can in bed or on a chair. 2. Hold the incentive spirometer in an upright position. 3. Breathe out normally. 4. Place the mouthpiece in your mouth and seal your lips tightly around it. 5. Breathe in slowly and as deeply as possible, raising the piston or the ball toward the top of the column. 6. Hold your breath for 3-5 seconds or for as long as possible. Allow the piston or ball to fall to the bottom of the column. 7. Remove the mouthpiece from your mouth and breathe out normally. 8. Rest for a few seconds and repeat Steps 1 through 7 at least 10 times every 1-2 hours when you are awake. Take your time and take a few normal breaths between deep breaths. 9. The spirometer may include an indicator to show your best effort. Use the indicator as a goal to work toward during each repetition. 10. After each set of 10 deep breaths, practice coughing to be sure your lungs are clear. If you have an incision  (the cut made at the time of surgery), support your incision when coughing by placing a pillow or rolled up towels firmly against it. Once you are able to get out of bed, walk around indoors and cough well. You may stop using the incentive spirometer when instructed by your caregiver.  RISKS AND COMPLICATIONS  Take your time so you do not  get dizzy or light-headed.  If you are in pain, you may need to take or ask for pain medication before doing incentive spirometry. It is harder to take a deep breath if you are having pain. AFTER USE  Rest and breathe slowly and easily.  It can be helpful to keep track of a log of your progress. Your caregiver can provide you with a simple table to help with this. If you are using the spirometer at home, follow these instructions: [place] IF:   You are having difficultly using the spirometer.  You have trouble using the spirometer as often as instructed.  Your pain medication is not giving enough relief while using the spirometer.  You develop fever of 100.5 F (38.1 C) or higher. SEEK IMMEDIATE MEDICAL CARE IF:   You cough up bloody sputum that had not been present before.  You develop fever of 102 F (38.9 C) or greater.  You develop worsening pain at or near the incision site. MAKE SURE YOU:   Understand these instructions.  Will watch your condition.  Will get help right away if you are not doing well or get worse. Document Released: 09/17/2006 Document Revised: 07/30/2011 Document Reviewed: 11/18/2006 [facility] Patient Information 2014 [place], [place].   ________________________________________________________________________

## 2019-09-17 NOTE — Progress Notes (Signed)
PCP - Chauncy Lean [facility] Cardiologist -   Chest x-ray -  EKG -  Stress Test -  ECHO -  Cardiac Cath -   Sleep Study -  CPAP -   Fasting Blood Sugar - 100's Checks Blood Sugar ___1__ times a day  Blood Thinner Instructions: RN advised pt. To ask MD. About instructions. Aspirin Instructions: Last Dose:  Anesthesia review:   Patient denies shortness of breath, fever, cough and chest pain at PAT appointment   Patient verbalized understanding of instructions that were given to them at the PAT appointment. Patient was also instructed that they will need to review over the PAT instructions again at home before surgery.

## 2019-09-18 ENCOUNTER — Ambulatory Visit: Payer: Self-pay | Admitting: Orthopedic Surgery

## 2019-09-18 ENCOUNTER — Encounter (HOSPITAL_COMMUNITY)
Admission: RE | Admit: 2019-09-18 | Discharge: 2019-09-18 | Disposition: A | Discharge status: Home / Self Care | Payer: Medicare PPO | Source: Ambulatory Visit | Attending: Orthopedic Surgery | Admitting: Orthopedic Surgery

## 2019-09-18 DIAGNOSIS — M1612 Unilateral primary osteoarthritis, left hip: Secondary | ICD-10-CM

## 2019-09-18 DIAGNOSIS — R9431 Abnormal electrocardiogram [ECG] [EKG]: Secondary | ICD-10-CM | POA: Diagnosis not present

## 2019-09-18 DIAGNOSIS — Z01818 Encounter for other preprocedural examination: Secondary | ICD-10-CM | POA: Diagnosis not present

## 2019-09-18 LAB — CBC
HCT: 35.9 % — ABNORMAL LOW (ref 36.0–46.0)
Hemoglobin: 11 g/dL — ABNORMAL LOW (ref 12.0–15.0)
MCH: 28.4 pg (ref 26.0–34.0)
MCHC: 30.6 g/dL (ref 30.0–36.0)
MCV: 92.5 fL (ref 80.0–100.0)
Platelets: 322 10*3/uL (ref 150–400)
RBC: 3.88 MIL/uL (ref 3.87–5.11)
RDW: 15.7 % — ABNORMAL HIGH (ref 11.5–15.5)
WBC: 8.4 10*3/uL (ref 4.0–10.5)
nRBC: 0 % (ref 0.0–0.2)

## 2019-09-18 LAB — BASIC METABOLIC PANEL
Anion gap: 9 (ref 5–15)
BUN: 21 mg/dL (ref 8–23)
CO2: 28 mmol/L (ref 22–32)
Calcium: 9.7 mg/dL (ref 8.9–10.3)
Chloride: 103 mmol/L (ref 98–111)
Creatinine, Ser: 1.07 mg/dL — ABNORMAL HIGH (ref 0.44–1.00)
GFR calc Af Amer: >60 mL/min (ref >60)
GFR calc non Af Amer: 53 mL/min — ABNORMAL LOW (ref >60)
Glucose, Bld: 114 mg/dL — ABNORMAL HIGH (ref 70–99)
Potassium: 3.6 mmol/L (ref 3.5–5.1)
Sodium: 140 mmol/L (ref 135–145)

## 2019-09-18 LAB — HEMOGLOBIN A1C
Hgb A1c MFr Bld: 6.5 % — ABNORMAL HIGH (ref 4.8–5.6)
Mean Plasma Glucose: 139.85 mg/dL

## 2019-09-18 LAB — ABO/RH: ABO/RH(D): A POS

## 2019-09-18 LAB — SURGICAL PCR SCREEN
MRSA, PCR: NEGATIVE
Staphylococcus aureus: NEGATIVE

## 2019-09-18 LAB — GLUCOSE, CAPILLARY: Glucose-Capillary: 105 mg/dL — ABNORMAL HIGH (ref 70–99)

## 2019-09-18 LAB — PROTIME-INR
INR: 1 (ref 0.8–1.2)
Prothrombin Time: 13 seconds (ref 11.4–15.2)

## 2019-09-18 NOTE — H&P (View-Only) (Signed)
TOTAL HIP ADMISSION H&P  Patient is admitted for left total hip arthroplasty.  Subjective:  Chief Complaint: left hip pain  HPI: Gina Rivas, 69 y.o. female, has a history of pain and functional disability in the left hip(s) due to arthritis and patient has failed non-surgical conservative treatments for greater than 12 weeks to include flexibility and strengthening excercises, supervised PT with diminished ADL's post treatment, use of assistive devices, weight reduction as appropriate and activity modification.  Onset of symptoms was gradual starting 5 years ago with gradually worsening course since that time.The patient noted no past surgery on the left hip(s).  Patient currently rates pain in the left hip at 10 out of 10 with activity. Patient has night pain, worsening of pain with activity and weight bearing, trendelenberg gait, pain that interfers with activities of daily living, pain with passive range of motion and crepitus. Patient has evidence of subchondral cysts, subchondral sclerosis, periarticular osteophytes and joint space narrowing by imaging studies. This condition presents safety issues increasing the risk of falls.  There is no current active infection.  There are no problems to display for this patient.  Past Medical History:  Diagnosis Date  . Arthritis   . Diabetes mellitus without complication (HCC)   . Gout   . Hyperlipemia   . Hypertension   . Hypothyroidism     Past Surgical History:  Procedure Laterality Date  . ABDOMINAL HYSTERECTOMY    . BACK SURGERY    . JOINT REPLACEMENT    . TONSILLECTOMY      No current facility-administered medications for this visit.   No current outpatient medications on file.   Facility-Administered Medications Ordered in Other Visits  Medication Dose Route Frequency Provider Last Rate Last Admin  . 0.9 %  sodium chloride infusion   Intravenous Continuous Wilho Sharpley, Arlys John, MD      . acetaminophen (OFIRMEV) IV 1,000 mg  1,000  mg Intravenous To OR Geraldina Parrott, Arlys John, MD      . ceFAZolin (ANCEF) IVPB 2g/100 mL premix  2 g Intravenous On Call to OR Samson Frederic, MD      . lactated ringers infusion   Intravenous Continuous Val Eagle, MD 50 mL/hr at 09/23/19 1026 New Bag at 09/23/19 1026  . tranexamic acid (CYKLOKAPRON) IVPB 1,000 mg  1,000 mg Intravenous To OR Renesmee Raine, Arlys John, MD       Allergies  Allergen Reactions  . Aspirin     GI Upset    Social History   Tobacco Use  . Smoking status: Never Smoker  . Smokeless tobacco: Never Used  Substance Use Topics  . Alcohol use: No    No family history on file.   Review of Systems  Constitutional: Negative.   HENT: Negative.   Eyes: Negative.   Respiratory: Negative.   Cardiovascular: Negative.   Gastrointestinal: Negative.   Endocrine: Negative.   Genitourinary: Positive for urgency.  Musculoskeletal: Positive for arthralgias and back pain.  Skin: Negative.   Allergic/Immunologic: Negative.   Neurological: Negative.   Hematological: Negative.   Psychiatric/Behavioral: Negative.     Objective:  Physical Exam  Vital signs in last 24 hours: @VSRANGES @  Labs:   Estimated body mass index is 38.25 kg/m as calculated from the following:   Height as of 09/23/19: 5\' 6"  (1.676 m).   Weight as of 09/23/19: 107.5 kg.   Imaging Review Plain radiographs demonstrate severe degenerative joint disease of the left hip(s). The bone quality appears to be adequate for age and reported activity  level.      Assessment/Plan:  End stage arthritis, left hip(s)  The patient history, physical examination, clinical judgement of the provider and imaging studies are consistent with end stage degenerative joint disease of the left hip(s) and total hip arthroplasty is deemed medically necessary. The treatment options including medical management, injection therapy, arthroscopy and arthroplasty were discussed at length. The risks and benefits of total hip  arthroplasty were presented and reviewed. The risks due to aseptic loosening, infection, stiffness, dislocation/subluxation,  thromboembolic complications and other imponderables were discussed.  The patient acknowledged the explanation, agreed to proceed with the plan and consent was signed. Patient is being admitted for inpatient treatment for surgery, pain control, PT, OT, prophylactic antibiotics, VTE prophylaxis, progressive ambulation and ADL's and discharge planning.The patient is planning to be discharged home with HEP. Plans for o/n stay.    Patient's anticipated LOS is less than 2 midnights, meeting these requirements: - Younger than 65 - Lives within 1 hour of care - Has a competent adult at home to recover with post-op recover - NO history of  - Chronic pain requiring opiods  - Diabetes  - Coronary Artery Disease  - Heart failure  - Heart attack  - Stroke  - DVT/VTE  - Cardiac arrhythmia  - Respiratory Failure/COPD  - Renal failure  - Anemia  - Advanced Liver disease     

## 2019-09-18 NOTE — H&P (Signed)
TOTAL HIP ADMISSION H&P  Patient is admitted for left total hip arthroplasty.  Subjective:  Chief Complaint: left hip pain  HPI: Gina Rivas, 69 y.o. female, has a history of pain and functional disability in the left hip(s) due to arthritis and patient has failed non-surgical conservative treatments for greater than 12 weeks to include flexibility and strengthening excercises, supervised PT with diminished ADL's post treatment, use of assistive devices, weight reduction as appropriate and activity modification.  Onset of symptoms was gradual starting 5 years ago with gradually worsening course since that time.The patient noted no past surgery on the left hip(s).  Patient currently rates pain in the left hip at 10 out of 10 with activity. Patient has night pain, worsening of pain with activity and weight bearing, trendelenberg gait, pain that interfers with activities of daily living, pain with passive range of motion and crepitus. Patient has evidence of subchondral cysts, subchondral sclerosis, periarticular osteophytes and joint space narrowing by imaging studies. This condition presents safety issues increasing the risk of falls.  There is no current active infection.  There are no problems to display for this patient.  Past Medical History:  Diagnosis Date  . Arthritis   . Diabetes mellitus without complication (HCC)   . Gout   . Hyperlipemia   . Hypertension   . Hypothyroidism     Past Surgical History:  Procedure Laterality Date  . ABDOMINAL HYSTERECTOMY    . BACK SURGERY    . JOINT REPLACEMENT    . TONSILLECTOMY      No current facility-administered medications for this visit.   No current outpatient medications on file.   Facility-Administered Medications Ordered in Other Visits  Medication Dose Route Frequency Provider Last Rate Last Admin  . 0.9 %  sodium chloride infusion   Intravenous Continuous Ayleah Hofmeister, Arlys John, MD      . acetaminophen (OFIRMEV) IV 1,000 mg  1,000  mg Intravenous To OR Aasim Restivo, Arlys John, MD      . ceFAZolin (ANCEF) IVPB 2g/100 mL premix  2 g Intravenous On Call to OR Samson Frederic, MD      . lactated ringers infusion   Intravenous Continuous Val Eagle, MD 50 mL/hr at 09/23/19 1026 New Bag at 09/23/19 1026  . tranexamic acid (CYKLOKAPRON) IVPB 1,000 mg  1,000 mg Intravenous To OR Debraann Livingstone, Arlys John, MD       Allergies  Allergen Reactions  . Aspirin     GI Upset    Social History   Tobacco Use  . Smoking status: Never Smoker  . Smokeless tobacco: Never Used  Substance Use Topics  . Alcohol use: No    No family history on file.   Review of Systems  Constitutional: Negative.   HENT: Negative.   Eyes: Negative.   Respiratory: Negative.   Cardiovascular: Negative.   Gastrointestinal: Negative.   Endocrine: Negative.   Genitourinary: Positive for urgency.  Musculoskeletal: Positive for arthralgias and back pain.  Skin: Negative.   Allergic/Immunologic: Negative.   Neurological: Negative.   Hematological: Negative.   Psychiatric/Behavioral: Negative.     Objective:  Physical Exam  Vital signs in last 24 hours: @VSRANGES @  Labs:   Estimated body mass index is 38.25 kg/m as calculated from the following:   Height as of 09/23/19: 5\' 6"  (1.676 m).   Weight as of 09/23/19: 107.5 kg.   Imaging Review Plain radiographs demonstrate severe degenerative joint disease of the left hip(s). The bone quality appears to be adequate for age and reported activity  level.      Assessment/Plan:  End stage arthritis, left hip(s)  The patient history, physical examination, clinical judgement of the provider and imaging studies are consistent with end stage degenerative joint disease of the left hip(s) and total hip arthroplasty is deemed medically necessary. The treatment options including medical management, injection therapy, arthroscopy and arthroplasty were discussed at length. The risks and benefits of total hip  arthroplasty were presented and reviewed. The risks due to aseptic loosening, infection, stiffness, dislocation/subluxation,  thromboembolic complications and other imponderables were discussed.  The patient acknowledged the explanation, agreed to proceed with the plan and consent was signed. Patient is being admitted for inpatient treatment for surgery, pain control, PT, OT, prophylactic antibiotics, VTE prophylaxis, progressive ambulation and ADL's and discharge planning.The patient is planning to be discharged home with HEP. Plans for o/n stay.    Patient's anticipated LOS is less than 2 midnights, meeting these requirements: - Younger than 51 - Lives within 1 hour of care - Has a competent adult at home to recover with post-op recover - NO history of  - Chronic pain requiring opiods  - Diabetes  - Coronary Artery Disease  - Heart failure  - Heart attack  - Stroke  - DVT/VTE  - Cardiac arrhythmia  - Respiratory Failure/COPD  - Renal failure  - Anemia  - Advanced Liver disease

## 2019-09-19 ENCOUNTER — Other Ambulatory Visit (HOSPITAL_COMMUNITY)
Admission: RE | Admit: 2019-09-19 | Discharge: 2019-09-19 | Disposition: A | Discharge status: Home / Self Care | Payer: Medicare PPO | Source: Ambulatory Visit | Attending: Orthopedic Surgery | Admitting: Orthopedic Surgery

## 2019-09-19 DIAGNOSIS — Z01812 Encounter for preprocedural laboratory examination: Secondary | ICD-10-CM | POA: Insufficient documentation

## 2019-09-19 DIAGNOSIS — Z20822 Contact with and (suspected) exposure to covid-19: Secondary | ICD-10-CM | POA: Insufficient documentation

## 2019-09-19 LAB — SARS CORONAVIRUS 2 (TAT 6-24 HRS): SARS Coronavirus 2: NEGATIVE

## 2019-09-23 ENCOUNTER — Encounter (HOSPITAL_COMMUNITY)
Admission: RE | Disposition: A | Discharge status: Home / Self Care | Payer: Self-pay | Source: Home / Self Care | Attending: Orthopedic Surgery

## 2019-09-23 ENCOUNTER — Observation Stay (HOSPITAL_COMMUNITY): Payer: Medicare PPO

## 2019-09-23 ENCOUNTER — Observation Stay (HOSPITAL_COMMUNITY)
Admission: RE | Admit: 2019-09-23 | Discharge: 2019-09-24 | Disposition: A | Discharge status: Home / Self Care | Payer: Medicare PPO | Attending: Orthopedic Surgery | Admitting: Orthopedic Surgery

## 2019-09-23 ENCOUNTER — Encounter (HOSPITAL_COMMUNITY): Payer: Self-pay | Admitting: Orthopedic Surgery

## 2019-09-23 ENCOUNTER — Ambulatory Visit (HOSPITAL_COMMUNITY): Payer: Medicare PPO | Admitting: Anesthesiology

## 2019-09-23 ENCOUNTER — Ambulatory Visit (HOSPITAL_COMMUNITY): Payer: Medicare PPO

## 2019-09-23 ENCOUNTER — Other Ambulatory Visit: Payer: Self-pay

## 2019-09-23 DIAGNOSIS — I10 Essential (primary) hypertension: Secondary | ICD-10-CM | POA: Diagnosis not present

## 2019-09-23 DIAGNOSIS — M87052 Idiopathic aseptic necrosis of left femur: Secondary | ICD-10-CM

## 2019-09-23 DIAGNOSIS — E119 Type 2 diabetes mellitus without complications: Secondary | ICD-10-CM | POA: Diagnosis not present

## 2019-09-23 DIAGNOSIS — Z09 Encounter for follow-up examination after completed treatment for conditions other than malignant neoplasm: Secondary | ICD-10-CM

## 2019-09-23 DIAGNOSIS — Z7984 Long term (current) use of oral hypoglycemic drugs: Secondary | ICD-10-CM | POA: Insufficient documentation

## 2019-09-23 DIAGNOSIS — M879 Osteonecrosis, unspecified: Principal | ICD-10-CM | POA: Insufficient documentation

## 2019-09-23 DIAGNOSIS — Z419 Encounter for procedure for purposes other than remedying health state, unspecified: Secondary | ICD-10-CM

## 2019-09-23 DIAGNOSIS — Z6838 Body mass index (BMI) 38.0-38.9, adult: Secondary | ICD-10-CM | POA: Insufficient documentation

## 2019-09-23 DIAGNOSIS — Z96649 Presence of unspecified artificial hip joint: Secondary | ICD-10-CM

## 2019-09-23 HISTORY — PX: TOTAL HIP ARTHROPLASTY: SHX124

## 2019-09-23 LAB — TYPE AND SCREEN
ABO/RH(D): A POS
Antibody Screen: NEGATIVE

## 2019-09-23 LAB — GLUCOSE, CAPILLARY
Glucose-Capillary: 131 mg/dL — ABNORMAL HIGH (ref 70–99)
Glucose-Capillary: 128 mg/dL — ABNORMAL HIGH (ref 70–99)
Glucose-Capillary: 166 mg/dL — ABNORMAL HIGH (ref 70–99)
Glucose-Capillary: 285 mg/dL — ABNORMAL HIGH (ref 70–99)

## 2019-09-23 SURGERY — ARTHROPLASTY, HIP, TOTAL, ANTERIOR APPROACH
Anesthesia: Spinal | Site: Hip | Laterality: Left

## 2019-09-23 MED ORDER — PHENYLEPHRINE HCL (PRESSORS) 10 MG/ML IV SOLN
INTRAVENOUS | Status: DC | PRN
  Administered 2019-09-23 (×5): 80 ug via INTRAVENOUS

## 2019-09-23 MED ORDER — CEFAZOLIN SODIUM-DEXTROSE 2-4 GM/100ML-% IV SOLN
2.0000 g | INTRAVENOUS | Status: AC
  Administered 2019-09-23: 2 g via INTRAVENOUS
  Filled 2019-09-23: qty 100

## 2019-09-23 MED ORDER — ONDANSETRON HCL 4 MG/2ML IJ SOLN
INTRAMUSCULAR | Status: DC | PRN
  Administered 2019-09-23: 4 mg via INTRAVENOUS

## 2019-09-23 MED ORDER — IRRISEPT - 450ML BOTTLE WITH 0.05% CHG IN STERILE WATER, USP 99.95% OPTIME
TOPICAL | Status: DC | PRN
  Administered 2019-09-23: 450 mL

## 2019-09-23 MED ORDER — SODIUM CHLORIDE 0.9 % IV SOLN: INTRAVENOUS | Status: DC

## 2019-09-23 MED ORDER — METHOCARBAMOL 1000 MG/10ML IJ SOLN
500.0000 mg | Freq: Four times a day (QID) | INTRAVENOUS | Status: DC | PRN
  Filled 2019-09-23: qty 5

## 2019-09-23 MED ORDER — POVIDONE-IODINE 10 % EX SWAB
2.0000 "application " | Freq: Once | CUTANEOUS | Status: AC
  Administered 2019-09-23: 2 via TOPICAL

## 2019-09-23 MED ORDER — CEFAZOLIN SODIUM-DEXTROSE 2-4 GM/100ML-% IV SOLN
2.0000 g | Freq: Four times a day (QID) | INTRAVENOUS | Status: AC
  Administered 2019-09-23 (×2): 2 g via INTRAVENOUS
  Filled 2019-09-23 (×2): qty 100

## 2019-09-23 MED ORDER — MENTHOL 3 MG MT LOZG: 1.0000 | LOZENGE | OROMUCOSAL | Status: DC | PRN

## 2019-09-23 MED ORDER — BUPIVACAINE HCL (PF) 0.25 % IJ SOLN
INTRAMUSCULAR | Status: AC
  Filled 2019-09-23: qty 30

## 2019-09-23 MED ORDER — PHENYLEPHRINE HCL-NACL 10-0.9 MG/250ML-% IV SOLN
INTRAVENOUS | Status: DC | PRN
  Administered 2019-09-23: 30 ug/min via INTRAVENOUS

## 2019-09-23 MED ORDER — KETOROLAC TROMETHAMINE 30 MG/ML IJ SOLN
INTRAMUSCULAR | Status: DC | PRN
  Administered 2019-09-23: 30 mg via INTRAVENOUS

## 2019-09-23 MED ORDER — ONDANSETRON HCL 4 MG/2ML IJ SOLN: 4.0000 mg | Freq: Four times a day (QID) | INTRAMUSCULAR | Status: DC | PRN

## 2019-09-23 MED ORDER — HYDROCODONE-ACETAMINOPHEN 7.5-325 MG PO TABS
1.0000 | ORAL_TABLET | ORAL | Status: DC | PRN
  Filled 2019-09-23: qty 1

## 2019-09-23 MED ORDER — ACETAMINOPHEN 10 MG/ML IV SOLN
1000.0000 mg | INTRAVENOUS | Status: AC
  Administered 2019-09-23: 1000 mg via INTRAVENOUS
  Filled 2019-09-23: qty 100

## 2019-09-23 MED ORDER — INSULIN ASPART 100 UNIT/ML ~~LOC~~ SOLN
0.0000 [IU] | Freq: Every day | SUBCUTANEOUS | Status: DC
  Administered 2019-09-23: 3 [IU] via SUBCUTANEOUS

## 2019-09-23 MED ORDER — METOCLOPRAMIDE HCL 5 MG/ML IJ SOLN: 5.0000 mg | Freq: Three times a day (TID) | INTRAMUSCULAR | Status: DC | PRN

## 2019-09-23 MED ORDER — ACETAMINOPHEN 325 MG PO TABS: 325.0000 mg | ORAL_TABLET | Freq: Four times a day (QID) | ORAL | Status: DC | PRN

## 2019-09-23 MED ORDER — PROPOFOL 500 MG/50ML IV EMUL
INTRAVENOUS | Status: DC | PRN
  Administered 2019-09-23: 75 ug/kg/min via INTRAVENOUS

## 2019-09-23 MED ORDER — SODIUM CHLORIDE (PF) 0.9 % IJ SOLN
INTRAMUSCULAR | Status: DC | PRN
  Administered 2019-09-23: 30 mL via INTRAVENOUS

## 2019-09-23 MED ORDER — DULAGLUTIDE 0.75 MG/0.5ML ~~LOC~~ SOAJ: 0.7500 mg | SUBCUTANEOUS | Status: DC

## 2019-09-23 MED ORDER — HYDROCODONE-ACETAMINOPHEN 5-325 MG PO TABS
1.0000 | ORAL_TABLET | ORAL | Status: DC | PRN
  Administered 2019-09-23 (×2): 1 via ORAL
  Administered 2019-09-24 (×3): 2 via ORAL
  Filled 2019-09-23: qty 2
  Filled 2019-09-23: qty 1
  Filled 2019-09-23 (×2): qty 2
  Filled 2019-09-23: qty 1

## 2019-09-23 MED ORDER — PHENOL 1.4 % MT LIQD: 1.0000 | OROMUCOSAL | Status: DC | PRN

## 2019-09-23 MED ORDER — INSULIN ASPART 100 UNIT/ML ~~LOC~~ SOLN
0.0000 [IU] | Freq: Three times a day (TID) | SUBCUTANEOUS | Status: DC
  Administered 2019-09-23: 1 [IU] via SUBCUTANEOUS
  Administered 2019-09-24: 3 [IU] via SUBCUTANEOUS
  Administered 2019-09-24: 2 [IU] via SUBCUTANEOUS

## 2019-09-23 MED ORDER — BUPIVACAINE IN DEXTROSE 0.75-8.25 % IT SOLN
INTRATHECAL | Status: DC | PRN
  Administered 2019-09-23: 2 mL via INTRATHECAL

## 2019-09-23 MED ORDER — MORPHINE SULFATE (PF) 2 MG/ML IV SOLN: 0.5000 mg | INTRAVENOUS | Status: DC | PRN

## 2019-09-23 MED ORDER — FENTANYL CITRATE (PF) 100 MCG/2ML IJ SOLN
INTRAMUSCULAR | Status: AC
  Filled 2019-09-23: qty 2

## 2019-09-23 MED ORDER — HYDROMORPHONE HCL 1 MG/ML IJ SOLN: 0.2500 mg | INTRAMUSCULAR | Status: DC | PRN

## 2019-09-23 MED ORDER — PIOGLITAZONE HCL 30 MG PO TABS
30.0000 mg | ORAL_TABLET | Freq: Every day | ORAL | Status: DC
  Filled 2019-09-23: qty 1

## 2019-09-23 MED ORDER — KETOROLAC TROMETHAMINE 30 MG/ML IJ SOLN
INTRAMUSCULAR | Status: AC
  Filled 2019-09-23: qty 1

## 2019-09-23 MED ORDER — METHOCARBAMOL 500 MG PO TABS: 500.0000 mg | ORAL_TABLET | Freq: Four times a day (QID) | ORAL | Status: DC | PRN

## 2019-09-23 MED ORDER — POLYETHYLENE GLYCOL 3350 17 G PO PACK: 17.0000 g | PACK | Freq: Every day | ORAL | Status: DC | PRN

## 2019-09-23 MED ORDER — MIDAZOLAM HCL 5 MG/5ML IJ SOLN
INTRAMUSCULAR | Status: DC | PRN
  Administered 2019-09-23: 2 mg via INTRAVENOUS

## 2019-09-23 MED ORDER — LABETALOL HCL 100 MG PO TABS
200.0000 mg | ORAL_TABLET | Freq: Two times a day (BID) | ORAL | Status: DC
  Administered 2019-09-23 – 2019-09-24 (×2): 200 mg via ORAL
  Filled 2019-09-23 (×2): qty 2

## 2019-09-23 MED ORDER — SODIUM CHLORIDE (PF) 0.9 % IJ SOLN
INTRAMUSCULAR | Status: AC
  Filled 2019-09-23: qty 50

## 2019-09-23 MED ORDER — MIDAZOLAM HCL 2 MG/2ML IJ SOLN
INTRAMUSCULAR | Status: AC
  Filled 2019-09-23: qty 2

## 2019-09-23 MED ORDER — TRANEXAMIC ACID-NACL 1000-0.7 MG/100ML-% IV SOLN
1000.0000 mg | INTRAVENOUS | Status: AC
  Administered 2019-09-23: 1000 mg via INTRAVENOUS
  Filled 2019-09-23: qty 100

## 2019-09-23 MED ORDER — ATORVASTATIN CALCIUM 20 MG PO TABS
20.0000 mg | ORAL_TABLET | Freq: Every day | ORAL | Status: DC
  Administered 2019-09-23: 20 mg via ORAL
  Filled 2019-09-23: qty 1

## 2019-09-23 MED ORDER — METOCLOPRAMIDE HCL 5 MG PO TABS: 5.0000 mg | ORAL_TABLET | Freq: Three times a day (TID) | ORAL | Status: DC | PRN

## 2019-09-23 MED ORDER — SODIUM CHLORIDE 0.9 % IR SOLN
Status: DC | PRN
  Administered 2019-09-23: 250 mL
  Administered 2019-09-23: 1000 mL

## 2019-09-23 MED ORDER — DEXAMETHASONE SODIUM PHOSPHATE 10 MG/ML IJ SOLN
INTRAMUSCULAR | Status: DC | PRN
  Administered 2019-09-23: 10 mg via INTRAVENOUS

## 2019-09-23 MED ORDER — LEVOTHYROXINE SODIUM 125 MCG PO TABS
125.0000 ug | ORAL_TABLET | Freq: Every day | ORAL | Status: DC
  Administered 2019-09-24: 125 ug via ORAL
  Filled 2019-09-23: qty 1

## 2019-09-23 MED ORDER — AMLODIPINE BESYLATE 10 MG PO TABS
10.0000 mg | ORAL_TABLET | Freq: Every day | ORAL | Status: DC
  Administered 2019-09-24: 10 mg via ORAL
  Filled 2019-09-23: qty 1

## 2019-09-23 MED ORDER — PROPOFOL 500 MG/50ML IV EMUL
INTRAVENOUS | Status: AC
  Filled 2019-09-23: qty 50

## 2019-09-23 MED ORDER — INSULIN DETEMIR 100 UNIT/ML ~~LOC~~ SOLN
40.0000 [IU] | Freq: Every day | SUBCUTANEOUS | Status: DC
  Administered 2019-09-23: 40 [IU] via SUBCUTANEOUS
  Filled 2019-09-23: qty 0.4

## 2019-09-23 MED ORDER — ASPIRIN 81 MG PO CHEW
81.0000 mg | CHEWABLE_TABLET | Freq: Two times a day (BID) | ORAL | Status: DC
  Administered 2019-09-23 – 2019-09-24 (×2): 81 mg via ORAL
  Filled 2019-09-23 (×2): qty 1

## 2019-09-23 MED ORDER — POVIDONE-IODINE 10 % EX SWAB: 2.0000 "application " | Freq: Once | CUTANEOUS | Status: AC

## 2019-09-23 MED ORDER — DIPHENHYDRAMINE HCL 12.5 MG/5ML PO ELIX: 12.5000 mg | ORAL_SOLUTION | ORAL | Status: DC | PRN

## 2019-09-23 MED ORDER — ALUM & MAG HYDROXIDE-SIMETH 200-200-20 MG/5ML PO SUSP: 30.0000 mL | ORAL | Status: DC | PRN

## 2019-09-23 MED ORDER — ISOPROPYL ALCOHOL 70 % SOLN
Status: DC | PRN
  Administered 2019-09-23: 1 via TOPICAL

## 2019-09-23 MED ORDER — FENTANYL CITRATE (PF) 100 MCG/2ML IJ SOLN
INTRAMUSCULAR | Status: DC | PRN
  Administered 2019-09-23: 100 ug via INTRAVENOUS

## 2019-09-23 MED ORDER — EPHEDRINE SULFATE 50 MG/ML IJ SOLN
INTRAMUSCULAR | Status: DC | PRN
  Administered 2019-09-23: 5 mg via INTRAVENOUS
  Administered 2019-09-23: 10 mg via INTRAVENOUS
  Administered 2019-09-23: 5 mg via INTRAVENOUS
  Administered 2019-09-23: 15 mg via INTRAVENOUS

## 2019-09-23 MED ORDER — KETOROLAC TROMETHAMINE 15 MG/ML IJ SOLN
7.5000 mg | Freq: Four times a day (QID) | INTRAMUSCULAR | Status: AC
  Administered 2019-09-23 – 2019-09-24 (×4): 7.5 mg via INTRAVENOUS
  Filled 2019-09-23 (×4): qty 1

## 2019-09-23 MED ORDER — PHENYLEPHRINE 40 MCG/ML (10ML) SYRINGE FOR IV PUSH (FOR BLOOD PRESSURE SUPPORT)
PREFILLED_SYRINGE | INTRAVENOUS | Status: AC
  Filled 2019-09-23: qty 20

## 2019-09-23 MED ORDER — WATER FOR IRRIGATION, STERILE IR SOLN
Status: DC | PRN
  Administered 2019-09-23: 2000 mL

## 2019-09-23 MED ORDER — LACTATED RINGERS IV SOLN: INTRAVENOUS | Status: DC

## 2019-09-23 MED ORDER — BUPIVACAINE HCL (PF) 0.25 % IJ SOLN
INTRAMUSCULAR | Status: DC | PRN
  Administered 2019-09-23: 30 mL

## 2019-09-23 MED ORDER — EPHEDRINE 5 MG/ML INJ
INTRAVENOUS | Status: AC
  Filled 2019-09-23: qty 20

## 2019-09-23 MED ORDER — DOCUSATE SODIUM 100 MG PO CAPS
100.0000 mg | ORAL_CAPSULE | Freq: Two times a day (BID) | ORAL | Status: DC
  Administered 2019-09-23 – 2019-09-24 (×2): 100 mg via ORAL
  Filled 2019-09-23 (×2): qty 1

## 2019-09-23 MED ORDER — PROPOFOL 1000 MG/100ML IV EMUL
INTRAVENOUS | Status: AC
  Filled 2019-09-23: qty 200

## 2019-09-23 MED ORDER — ONDANSETRON HCL 4 MG PO TABS: 4.0000 mg | ORAL_TABLET | Freq: Four times a day (QID) | ORAL | Status: DC | PRN

## 2019-09-23 MED ORDER — DEXAMETHASONE SODIUM PHOSPHATE 10 MG/ML IJ SOLN
10.0000 mg | Freq: Once | INTRAMUSCULAR | Status: AC
  Administered 2019-09-24: 10 mg via INTRAVENOUS
  Filled 2019-09-23: qty 1

## 2019-09-23 SURGICAL SUPPLY — 60 items
BAG DECANTER FOR FLEXI CONT (MISCELLANEOUS) IMPLANT
BAG ZIPLOCK 12X15 (MISCELLANEOUS) IMPLANT
BLADE SURG SZ10 CARB STEEL (BLADE) IMPLANT
CHLORAPREP W/TINT 26 (MISCELLANEOUS) ×2 IMPLANT
COVER PERINEAL POST (MISCELLANEOUS) ×2 IMPLANT
COVER SURGICAL LIGHT HANDLE (MISCELLANEOUS) ×2 IMPLANT
COVER WAND RF STERILE (DRAPES) IMPLANT
CUP ACET PNNCL SECTR W/GRIP 56 (Hips) IMPLANT
DECANTER SPIKE VIAL GLASS SM (MISCELLANEOUS) ×2 IMPLANT
DERMABOND ADVANCED (GAUZE/BANDAGES/DRESSINGS) ×1
DERMABOND ADVANCED .7 DNX12 (GAUZE/BANDAGES/DRESSINGS) ×2 IMPLANT
DRAPE IMP U-DRAPE 54X76 (DRAPES) ×2 IMPLANT
DRAPE SHEET LG 3/4 BI-LAMINATE (DRAPES) ×6 IMPLANT
DRAPE STERI IOBAN 125X83 (DRAPES) IMPLANT
DRAPE U-SHAPE 47X51 STRL (DRAPES) ×4 IMPLANT
DRESSING AQUACEL AG SP 3.5X10 (GAUZE/BANDAGES/DRESSINGS) IMPLANT
DRSG AQUACEL AG ADV 3.5X10 (GAUZE/BANDAGES/DRESSINGS) ×2 IMPLANT
DRSG AQUACEL AG SP 3.5X10 (GAUZE/BANDAGES/DRESSINGS) ×2
ELECT REM PT RETURN 15FT ADLT (MISCELLANEOUS) ×2 IMPLANT
GAUZE SPONGE 4X4 12PLY STRL (GAUZE/BANDAGES/DRESSINGS) ×2 IMPLANT
GLOVE BIO SURGEON STRL SZ8.5 (GLOVE) ×4 IMPLANT
GLOVE BIOGEL PI IND STRL 8.5 (GLOVE) ×1 IMPLANT
GLOVE BIOGEL PI INDICATOR 8.5 (GLOVE) ×1
GOWN SPEC L3 XXLG W/TWL (GOWN DISPOSABLE) ×2 IMPLANT
HANDPIECE INTERPULSE COAX TIP (DISPOSABLE) ×1
HEAD CERAMIC DELTA 36 PLUS 1.5 (Hips) ×1 IMPLANT
HOLDER FOLEY CATH W/STRAP (MISCELLANEOUS) ×2 IMPLANT
HOOD PEEL AWAY FLYTE STAYCOOL (MISCELLANEOUS) ×8 IMPLANT
JET LAVAGE IRRISEPT WOUND (IRRIGATION / IRRIGATOR) ×2
KIT TURNOVER KIT A (KITS) IMPLANT
LAVAGE JET IRRISEPT WOUND (IRRIGATION / IRRIGATOR) ×1 IMPLANT
LINER NEUTRAL 52MMX36MMX56N (Liner) ×1 IMPLANT
MANIFOLD NEPTUNE II (INSTRUMENTS) ×2 IMPLANT
MARKER SKIN DUAL TIP RULER LAB (MISCELLANEOUS) ×2 IMPLANT
NDL SAFETY ECLIPSE 18X1.5 (NEEDLE) ×1 IMPLANT
NDL SPNL 18GX3.5 QUINCKE PK (NEEDLE) ×1 IMPLANT
NEEDLE HYPO 18GX1.5 SHARP (NEEDLE) ×1
NEEDLE SPNL 18GX3.5 QUINCKE PK (NEEDLE) ×2 IMPLANT
PACK ANTERIOR HIP CUSTOM (KITS) ×2 IMPLANT
PENCIL SMOKE EVACUATOR (MISCELLANEOUS) IMPLANT
PINN SECTOR W/GRIP ACE CUP 56 (Hips) ×2 IMPLANT
SAW OSC TIP CART 19.5X105X1.3 (SAW) ×2 IMPLANT
SCREW 6.5MMX35MM (Screw) ×1 IMPLANT
SEALER BIPOLAR AQUA 6.0 (INSTRUMENTS) ×2 IMPLANT
SET HNDPC FAN SPRY TIP SCT (DISPOSABLE) ×1 IMPLANT
STEM TRI LOC BPS SZ4 W GRIPTON (Hips) IMPLANT
SUT ETHIBOND NAB CT1 #1 30IN (SUTURE) ×4 IMPLANT
SUT MNCRL AB 3-0 PS2 18 (SUTURE) ×2 IMPLANT
SUT MNCRL AB 4-0 PS2 18 (SUTURE) ×2 IMPLANT
SUT MON AB 2-0 CT1 36 (SUTURE) ×4 IMPLANT
SUT STRATAFIX PDO 1 14 VIOLET (SUTURE) ×1
SUT STRATFX PDO 1 14 VIOLET (SUTURE) ×1
SUT VIC AB 2-0 CT1 27 (SUTURE) ×1
SUT VIC AB 2-0 CT1 TAPERPNT 27 (SUTURE) ×1 IMPLANT
SUTURE STRATFX PDO 1 14 VIOLET (SUTURE) ×1 IMPLANT
SYR 3ML LL SCALE MARK (SYRINGE) ×2 IMPLANT
TRAY FOLEY MTR SLVR 16FR STAT (SET/KITS/TRAYS/PACK) IMPLANT
TRI LOC BPS SZ 4 W GRIPTON (Hips) ×2 IMPLANT
WATER STERILE IRR 1000ML POUR (IV SOLUTION) ×2 IMPLANT
YANKAUER SUCT BULB TIP 10FT TU (MISCELLANEOUS) ×2 IMPLANT

## 2019-09-23 NOTE — Interval H&P Note (Signed)
History and Physical Interval Note:  09/23/2019 10:34 AM  Gina Rivas  has presented today for surgery, with the diagnosis of degenerative joint disease left hip.  The various methods of treatment have been discussed with the patient and family. After consideration of risks, benefits and other options for treatment, the patient has consented to  Procedure(s): TOTAL HIP ARTHROPLASTY ANTERIOR APPROACH (Left) as a surgical intervention.  The patient's history has been reviewed, patient examined, no change in status, stable for surgery.  I have reviewed the patient's chart and labs.  Questions were answered to the patient's satisfaction.     Iline Oven Kristan Brummitt

## 2019-09-23 NOTE — Discharge Instructions (Signed)
°Dr. Kijuan Gallicchio °Joint Replacement Specialist °[facility] °[address] °[place], [place] [address] °[phone] ° ° °TOTAL HIP REPLACEMENT POSTOPERATIVE DIRECTIONS ° ° ° °Hip Rehabilitation, Guidelines Following Surgery  ° °WEIGHT BEARING °Weight bearing as tolerated with assist device (walker, cane, etc) as directed, use it as long as suggested by your surgeon or therapist, typically at least 4-6 weeks. ° °The results of a hip operation are greatly improved after range of motion and muscle strengthening exercises. Follow all safety measures which are given to protect your hip. If any of these exercises cause increased pain or swelling in your joint, decrease the amount until you are comfortable again. Then slowly increase the exercises. Call your caregiver if you have problems or questions.  ° °HOME CARE INSTRUCTIONS  °Most of the following instructions are designed to prevent the dislocation of your new hip.  °Remove items at home which could result in a fall. This includes throw rugs or furniture in walking pathways.  °Continue medications as instructed at time of discharge. °· You may have some home medications which will be placed on hold until you complete the course of blood thinner medication. °· You may start showering once you are discharged home. Do not remove your dressing. °Do not put on socks or shoes without following the instructions of your caregivers.   °Sit on chairs with arms. Use the chair arms to help push yourself up when arising.  °Arrange for the use of a toilet seat elevator so you are not sitting low.  °· Walk with walker as instructed.  °You may resume a sexual relationship in one month or when given the OK by your caregiver.  °Use walker as long as suggested by your caregivers.  °You may put full weight on your legs and walk as much as is comfortable. °Avoid periods of inactivity such as sitting longer than an hour when not asleep. This helps prevent  blood clots.  °You may return to work once you are cleared by your surgeon.  °Do not drive a car for 6 weeks or until released by your surgeon.  °Do not drive while taking narcotics.  °Wear elastic stockings for two weeks following surgery during the day but you may remove then at night.  °Make sure you keep all of your appointments after your operation with all of your doctors and caregivers. You should call the office at the above phone number and make an appointment for approximately two weeks after the date of your surgery. °Please pick up a stool softener and laxative for home use as long as you are requiring pain medications. °· ICE to the affected hip every three hours for 30 minutes at a time and then as needed for pain and swelling. Continue to use ice on the hip for pain and swelling from surgery. You may notice swelling that will progress down to the foot and ankle.  This is normal after surgery.  Elevate the leg when you are not up walking on it.   °It is important for you to complete the blood thinner medication as prescribed by your doctor. °· Continue to use the breathing machine which will help keep your temperature down.  It is common for your temperature to cycle up and down following surgery, especially at night when you are not up moving around and exerting yourself.  The breathing machine keeps your lungs expanded and your temperature down. ° °RANGE OF MOTION AND STRENGTHENING EXERCISES  °These exercises are   designed to help you keep full movement of your hip joint. Follow your caregiver's or physical therapist's instructions. Perform all exercises about fifteen times, three times per day or as directed. Exercise both hips, even if you have had only one joint replacement. These exercises can be done on a training (exercise) mat, on the floor, on a table or on a bed. Use whatever works the best and is most comfortable for you. Use music or television while you are exercising so that the exercises  are a pleasant break in your day. This will make your life better with the exercises acting as a break in routine you can look forward to.  °Lying on your back, slowly slide your foot toward your buttocks, raising your knee up off the floor. Then slowly slide your foot back down until your leg is straight again.  °Lying on your back spread your legs as far apart as you can without causing discomfort.  °Lying on your side, raise your upper leg and foot straight up from the floor as far as is comfortable. Slowly lower the leg and repeat.  °Lying on your back, tighten up the muscle in the front of your thigh (quadriceps muscles). You can do this by keeping your leg straight and trying to raise your heel off the floor. This helps strengthen the largest muscle supporting your knee.  °Lying on your back, tighten up the muscles of your buttocks both with the legs straight and with the knee bent at a comfortable angle while keeping your heel on the floor.  ° °SKILLED REHAB INSTRUCTIONS: °If the patient is transferred to a skilled rehab facility following release from the hospital, a list of the current medications will be sent to the facility for the patient to continue.  When discharged from the skilled rehab facility, please have the facility set up the patient's Home Health Physical Therapy prior to being released. Also, the skilled facility will be responsible for providing the patient with their medications at time of release from the facility to include their pain medication and their blood thinner medication. If the patient is still at the rehab facility at time of the two week follow up appointment, the skilled rehab facility will also need to assist the patient in arranging follow up appointment in our office and any transportation needs. ° °MAKE SURE YOU:  °Understand these instructions.  °Will watch your condition.  °Will get help right away if you are not doing well or get worse. ° °Pick up stool softner and  laxative for home use following surgery while on pain medications. °Do not remove your dressing. °The dressing is waterproof--it is OK to take showers. °Continue to use ice for pain and swelling after surgery. °Do not use any lotions or creams on the incision until instructed by your surgeon. °Total Hip Protocol. ° ° °

## 2019-09-23 NOTE — Transfer of Care (Signed)
Immediate Anesthesia Transfer of Care Note  Patient: Gina Rivas  Procedure(s) Performed: TOTAL HIP ARTHROPLASTY ANTERIOR APPROACH (Left Hip)  Patient Location: PACU  Anesthesia Type:Spinal  Level of Consciousness: sedated, patient cooperative and responds to stimulation  Airway & Oxygen Therapy: Patient Spontanous Breathing and Patient connected to face mask oxygen  Post-op Assessment: Report given to RN and Post -op Vital signs reviewed and stable  Post vital signs: Reviewed and stable  Last Vitals:  Vitals Value Taken Time  BP 116/53 09/23/19 1443  Temp    Pulse 72 09/23/19 1444  Resp 19 09/23/19 1444  SpO2 96 % 09/23/19 1444  Vitals shown include unvalidated device data.  Last Pain:  Vitals:   09/23/19 0956  TempSrc:   PainSc: 8       Patients Stated Pain Goal: 4 (09/23/19 0956)  Complications: No apparent anesthesia complications

## 2019-09-23 NOTE — Op Note (Signed)
OPERATIVE REPORT  SURGEON: Rod Can, MD   ASSISTANT: Nehemiah Massed, PA-C.  PREOPERATIVE DIAGNOSIS: Left hip avascular necrosis.   POSTOPERATIVE DIAGNOSIS: Left hip avascular necrosis.   PROCEDURE: Left total hip arthroplasty, anterior approach.   IMPLANTS: DePuy Tri Lock stem, size 4, hi offset. DePuy Pinnacle Cup, size 56 mm. DePuy Altrx liner, size 36 by 56 mm, neutral. DePuy Biolox ceramic head ball, size 36 + 1.5 mm. 6.5 mm cancellous bone screw x1.   ANESTHESIA:  MAC and Spinal  ESTIMATED BLOOD LOSS:-400 mL    ANTIBIOTICS: 2 g Ancef.  DRAINS: None.  COMPLICATIONS: None.   CONDITION: PACU - hemodynamically stable.   BRIEF CLINICAL NOTE: Gina Rivas is a 69 y.o. female with a long-standing history of Left hip avascular necrosis. After failing conservative management, the patient was indicated for total hip arthroplasty. The risks, benefits, and alternatives to the procedure were explained, and the patient elected to proceed.  PROCEDURE IN DETAIL: Surgical site was marked by myself in the pre-op holding area. Once inside the operating room, spinal anesthesia was obtained, and a foley catheter was inserted. The patient was then positioned on the Hana table.  All bony prominences were well padded.  The hip was prepped and draped in the normal sterile surgical fashion.  A time-out was called verifying side and site of surgery. The patient received IV antibiotics within 60 minutes of beginning the procedure.   The direct anterior approach to the hip was performed through the Hueter interval.  Lateral femoral circumflex vessels were treated with the Auqumantys. The anterior capsule was exposed and an inverted T capsulotomy was made. The femoral neck cut was made to the level of the templated cut.  A corkscrew was placed into the head and the head was removed.  The femoral head was found to have eburnated bone. The head was passed to the back table and was measured.    Acetabular exposure was achieved, and the pulvinar and labrum were excised. Sequential reaming of the acetabulum was then performed up to a size 55 mm reamer. A 56 mm cup was then opened and impacted into place at approximately 40 degrees of abduction and 20 degrees of anteversion. I elected to augment the already acceptable press fit fixation with a single 6.5 mm cancellous bone screw. The final polyethylene liner was impacted into place and acetabular osteophytes were removed.    I then gained femoral exposure taking care to protect the abductors and greater trochanter.  This was performed using standard external rotation, extension, and adduction.  The capsule was peeled off the inner aspect of the greater trochanter, taking care to preserve the short external rotators. A cookie cutter was used to enter the femoral canal, and then the femoral canal finder was placed.  Sequential broaching was performed up to a size 4.  Calcar planer was used on the femoral neck remnant.  I placed a hi offset neck and a trial head ball.  The hip was reduced.  Leg lengths and offset were checked fluoroscopically.  The hip was dislocated and trial components were removed.  The final implants were placed, and the hip was reduced.  Fluoroscopy was used to confirm component position and leg lengths.  At 90 degrees of external rotation and full extension, the hip was stable to an anterior directed force.   The wound was copiously irrigated with Irrisept solution and normal saline using pule lavage.  Marcaine solution was injected into the periarticular soft tissue.  The wound  was closed in layers using #1 Stratafix for the fascia, 2-0 Vicryl for the subcutaneous fat, 2-0 Monocryl for the deep dermal layer, 3-0 running Monocryl subcuticular stitch, and Dermabond for the skin.  Once the glue was fully dried, an Aquacell Ag dressing was applied.  The patient was transported to the recovery room in stable condition.  Sponge, needle,  and instrument counts were correct at the end of the case x2.  The patient tolerated the procedure well and there were no known complications.  Please note that a surgical assistant was a medical necessity for this procedure to perform it in a safe and expeditious manner. Assistant was necessary to provide appropriate retraction of vital neurovascular structures, to prevent femoral fracture, and to allow for anatomic placement of the prosthesis.

## 2019-09-23 NOTE — Anesthesia Preprocedure Evaluation (Signed)
Anesthesia Evaluation  Patient identified by MRN, date of birth, ID band Patient awake    Reviewed: Allergy & Precautions, NPO status , Patient's Chart, lab work & pertinent test results  Airway Mallampati: II  TM Distance: >3 FB Neck ROM: Full    Dental no notable dental hx.    Pulmonary neg pulmonary ROS,    Pulmonary exam normal breath sounds clear to auscultation       Cardiovascular hypertension, Normal cardiovascular exam Rhythm:Regular Rate:Normal     Neuro/Psych negative neurological ROS  negative psych ROS   GI/Hepatic negative GI ROS, Neg liver ROS,   Endo/Other  diabetes, Insulin DependentHypothyroidism Morbid obesity  Renal/GU negative Renal ROS  negative genitourinary   Musculoskeletal  (+) Arthritis , Osteoarthritis,    Abdominal   Peds negative pediatric ROS (+)  Hematology negative hematology ROS (+)   Anesthesia Other Findings   Reproductive/Obstetrics negative OB ROS                             Anesthesia Physical Anesthesia Plan  ASA: III  Anesthesia Plan: Spinal   Post-op Pain Management:    Induction: Intravenous  PONV Risk Score and Plan: Ondansetron, Dexamethasone and Treatment may vary due to age or medical condition  Airway Management Planned: Simple Face Mask  Additional Equipment:   Intra-op Plan:   Post-operative Plan:   Informed Consent: I have reviewed the patients History and Physical, chart, labs and discussed the procedure including the risks, benefits and alternatives for the proposed anesthesia with the patient or authorized representative who has indicated his/her understanding and acceptance.     Dental advisory given  Plan Discussed with: CRNA and Surgeon  Anesthesia Plan Comments:         Anesthesia Quick Evaluation

## 2019-09-23 NOTE — Anesthesia Procedure Notes (Signed)
Spinal  Patient location during procedure: OR Start time: 09/23/2019 11:49 AM End time: 09/23/2019 11:55 AM Staffing Performed: anesthesiologist  Preanesthetic Checklist Completed: patient identified, IV checked, site marked, risks and benefits discussed, surgical consent, monitors and equipment checked, pre-op evaluation and timeout performed Spinal Block Patient position: sitting Prep: ChloraPrep Patient monitoring: heart rate, continuous pulse ox and blood pressure Approach: midline Location: L3-4 Injection technique: single-shot Needle Needle type: Sprotte  Needle gauge: 24 G Needle length: 9 cm Assessment Sensory level: T6 Additional Notes Expiration date of kit checked and confirmed. Patient tolerated procedure well, without complications.

## 2019-09-24 ENCOUNTER — Encounter: Payer: Self-pay | Admitting: *Deleted

## 2019-09-24 DIAGNOSIS — M879 Osteonecrosis, unspecified: Secondary | ICD-10-CM | POA: Diagnosis not present

## 2019-09-24 LAB — CBC
HCT: 28.8 % — ABNORMAL LOW (ref 36.0–46.0)
Hemoglobin: 8.8 g/dL — ABNORMAL LOW (ref 12.0–15.0)
MCH: 28.3 pg (ref 26.0–34.0)
MCHC: 30.6 g/dL (ref 30.0–36.0)
MCV: 92.6 fL (ref 80.0–100.0)
Platelets: 265 10*3/uL (ref 150–400)
RBC: 3.11 MIL/uL — ABNORMAL LOW (ref 3.87–5.11)
RDW: 15.6 % — ABNORMAL HIGH (ref 11.5–15.5)
WBC: 10.5 10*3/uL (ref 4.0–10.5)
nRBC: 0 % (ref 0.0–0.2)

## 2019-09-24 LAB — BASIC METABOLIC PANEL
Anion gap: 8 (ref 5–15)
BUN: 14 mg/dL (ref 8–23)
CO2: 27 mmol/L (ref 22–32)
Calcium: 8.3 mg/dL — ABNORMAL LOW (ref 8.9–10.3)
Chloride: 103 mmol/L (ref 98–111)
Creatinine, Ser: 1.01 mg/dL — ABNORMAL HIGH (ref 0.44–1.00)
GFR calc Af Amer: >60 mL/min (ref >60)
GFR calc non Af Amer: 57 mL/min — ABNORMAL LOW (ref >60)
Glucose, Bld: 222 mg/dL — ABNORMAL HIGH (ref 70–99)
Potassium: 4.3 mmol/L (ref 3.5–5.1)
Sodium: 138 mmol/L (ref 135–145)

## 2019-09-24 LAB — GLUCOSE, CAPILLARY
Glucose-Capillary: 164 mg/dL — ABNORMAL HIGH (ref 70–99)
Glucose-Capillary: 249 mg/dL — ABNORMAL HIGH (ref 70–99)

## 2019-09-24 MED ORDER — HYDROCODONE-ACETAMINOPHEN 5-325 MG PO TABS: 1.0000 | ORAL_TABLET | ORAL | 0 refills | Status: AC | PRN

## 2019-09-24 MED ORDER — ASPIRIN 81 MG PO CHEW: 81.0000 mg | CHEWABLE_TABLET | Freq: Two times a day (BID) | ORAL | 1 refills | Status: AC

## 2019-09-24 MED ORDER — ONDANSETRON HCL 4 MG PO TABS: 4.0000 mg | ORAL_TABLET | Freq: Four times a day (QID) | ORAL | 0 refills | Status: AC | PRN

## 2019-09-24 MED ORDER — HYDROCODONE-ACETAMINOPHEN 5-325 MG PO TABS: 1.0000 | ORAL_TABLET | ORAL | 0 refills | Status: DC | PRN

## 2019-09-24 MED ORDER — DOCUSATE SODIUM 100 MG PO CAPS: 100.0000 mg | ORAL_CAPSULE | Freq: Two times a day (BID) | ORAL | 1 refills | Status: AC

## 2019-09-24 NOTE — TOC Progression Note (Signed)
Transition of Care [facility]) - Progression Note    Patient Details  Name: Gina Rivas MRN: [id number] Date of Birth: [birth year]  Transition of Care [facility]) CM/SW Contact  Clearance Coots, LCSW Phone Number: 09/24/2019, 10:48 AM  Clinical Narrative:    Therapy Plan: HEP Patient has RW and declines 3 in 1.     Barriers to Discharge: No Barriers Identified  Expected Discharge Plan and Services           Expected Discharge Date: 09/24/19               DME Arranged: N/A(HAS RW, and High commode.) DME Agency: NA         HH Agency: NA         Social Determinants of Health (SDOH) Interventions    Readmission Risk Interventions No flowsheet data found.

## 2019-09-24 NOTE — TOC Progression Note (Addendum)
Transition of Care [facility]) - Progression Note    Patient Details  Name: Gina Rivas MRN: [id number] Date of Birth: [birth year]  [facility]) CM/SW Contact  Clearance Coots, LCSW Phone Number: 09/24/2019, 2:14 PM  Clinical Narrative:    Patient now request a 3 IN1. CSW ordered  through [employer] and delivered to bedside.      Barriers to Discharge: No Barriers Identified  Expected Discharge Plan and Services           Expected Discharge Date: 09/24/19               DME Arranged: 3-N-1 DME Agency: [employer] Date DME Agency Contacted: 09/24/19 Time DME Agency Contacted: 1121 Representative spoke with at DME Agency: Beryl Meager Agency: NA         Social Determinants of Health (SDOH) Interventions    Readmission Risk Interventions No flowsheet data found.

## 2019-09-24 NOTE — Progress Notes (Signed)
Physical Therapy Treatment Patient Details Name: Gina Rivas MRN: [id number] DOB: 06/24/1950 Today's Date: 09/24/2019    History of Present Illness Pt s/p L THR and with hx of DM and hip surgery    PT Comments    Pt very motivated and eager for dc home.  Pt ambulated increased distance in hall, negotiated stairs and reviewed car transfers as well as HEP program with written instruction provided and reviewed.   Follow Up Recommendations  Follow surgeon's recommendation for DC plan and follow-up therapies     Equipment Recommendations  3in1 (PT)    Recommendations for Other Services       Precautions / Restrictions Precautions Precautions: Fall Restrictions Weight Bearing Restrictions: No    Mobility  Bed Mobility Overal bed mobility: Needs Assistance Bed Mobility: Sit to Supine       Sit to supine: Min guard   General bed mobility comments: cues for seqeunce and use of gait belt to self assist  Transfers Overall transfer level: Needs assistance Equipment used: Rolling walker (2 wheeled) Transfers: Sit to/from Stand Sit to Stand: Min guard;Supervision         General transfer comment: cues for LE management and use of UEs to self assist  Ambulation/Gait Ambulation/Gait assistance: Min guard;Supervision Gait Distance (Feet): 90 Feet Assistive device: Rolling walker (2 wheeled) Gait Pattern/deviations: Step-to pattern;Decreased step length - right;Decreased step length - left;Shuffle;Trunk flexed Gait velocity: decr   General Gait Details: cues for sequence, posture and position from RW; distance ltd by fatigue   Stairs Stairs: Yes Stairs assistance: Min assist Stair Management: One rail Left;Step to pattern;Forwards;With cane Number of Stairs: 3 General stair comments: min cues for sequence    Wheelchair Mobility    Modified Rankin (Stroke Patients Only)       Balance Overall balance assessment: Mild deficits observed, not formally tested                                           Cognition Arousal/Alertness: Awake/alert Behavior During Therapy: WFL for tasks assessed/performed Overall Cognitive Status: Within Functional Limits for tasks assessed                                        Exercises Total Joint Exercises Ankle Circles/Pumps: AROM;Both;20 reps;Supine Quad Sets: AROM;Both;10 reps;Supine Heel Slides: AAROM;Left;Supine;10 reps Hip ABduction/ADduction: AAROM;Left;Supine;10 reps    General Comments        Pertinent Vitals/Pain Pain Assessment: 0-10 Pain Score: 3  Pain Location: L hip Pain Descriptors / Indicators: Aching;Sore Pain Intervention(s): Limited activity within patient's tolerance;Monitored during session;Premedicated before session;Ice applied    Home Living                      Prior Function            PT Goals (current goals can now be found in the care plan section) Acute Rehab PT Goals Patient Stated Goal: Regain IND PT Goal Formulation: With patient Time For Goal Achievement: 10/08/19 Potential to Achieve Goals: Good Progress towards PT goals: Progressing toward goals    Frequency    7X/week      PT Plan Current plan remains appropriate    Co-evaluation              AM-PAC  PT "6 Clicks" Mobility   Outcome Measure  Help needed turning from your back to your side while in a flat bed without using bedrails?: A Little Help needed moving from lying on your back to sitting on the side of a flat bed without using bedrails?: A Little Help needed moving to and from a bed to a chair (including a wheelchair)?: A Little Help needed standing up from a chair using your arms (e.g., wheelchair or bedside chair)?: A Little Help needed to walk in hospital room?: A Little Help needed climbing 3-5 steps with a railing? : A Little 6 Click Score: 18    End of Session Equipment Utilized During Treatment: Gait belt Activity Tolerance:  Patient tolerated treatment well;Patient limited by fatigue Patient left: in bed;with call bell/phone within reach;with family/visitor present Nurse Communication: Mobility status PT Visit Diagnosis: Difficulty in walking, not elsewhere classified (R26.2)     Time: 6967-8938 PT Time Calculation (min) (ACUTE ONLY): 33 min  Charges:  $Gait Training: 8-22 mins $Therapeutic Exercise: 8-22 mins                     Mauro Kaufmann PT Acute Rehabilitation Services Pager [phone] Office [phone]    Lauren Modisette 09/24/2019, 2:39 PM

## 2019-09-24 NOTE — Evaluation (Signed)
Physical Therapy Evaluation Patient Details Name: Gina Rivas MRN: [id number] DOB: 1950/11/15 Today's Date: 09/24/2019   History of Present Illness  Pt s/p L THR and with hx of DM and hip surgery  Clinical Impression  Pt s/p L THR and presents with decreased L LE strength/ROM and post op pain limiting functional mobility.  Pt should progress to dc home with family assist.    Follow Up Recommendations Follow surgeon's recommendation for DC plan and follow-up therapies    Equipment Recommendations  3in1 (PT)    Recommendations for Other Services       Precautions / Restrictions Precautions Precautions: Fall Restrictions Weight Bearing Restrictions: No      Mobility  Bed Mobility Overal bed mobility: Needs Assistance Bed Mobility: Supine to Sit     Supine to sit: Min assist     General bed mobility comments: cues for seqeunce and use of R LE to self assist  Transfers Overall transfer level: Needs assistance Equipment used: Rolling walker (2 wheeled) Transfers: Sit to/from Stand Sit to Stand: Min assist         General transfer comment: cues for LE management and use of UEs to self assist  Ambulation/Gait Ambulation/Gait assistance: Min assist Gait Distance (Feet): 35 Feet Assistive device: Rolling walker (2 wheeled) Gait Pattern/deviations: Step-to pattern;Decreased step length - right;Decreased step length - left;Shuffle;Trunk flexed Gait velocity: decr   General Gait Details: cues for sequence, posture and position from RW; distance ltd by fatigue  Stairs            Wheelchair Mobility    Modified Rankin (Stroke Patients Only)       Balance Overall balance assessment: Mild deficits observed, not formally tested                                           Pertinent Vitals/Pain Pain Assessment: 0-10 Pain Score: 4  Pain Location: L hip Pain Descriptors / Indicators: Aching;Sore Pain Intervention(s): Limited activity  within patient's tolerance;Monitored during session;Premedicated before session;Ice applied    Home Living Family/patient expects to be discharged to:: Private residence Living Arrangements: Children Available Help at Discharge: Family Type of Home: House Home Access: Stairs to enter Entrance Stairs-Rails: Left Entrance Stairs-Number of Steps: 3 Home Layout: One level Home Equipment: Walker - 2 wheels;Cane - single point      Prior Function Level of Independence: Independent;Independent with assistive device(s)         Comments: used RW     Hand Dominance        Extremity/Trunk Assessment   Upper Extremity Assessment Upper Extremity Assessment: Overall WFL for tasks assessed    Lower Extremity Assessment Lower Extremity Assessment: LLE deficits/detail LLE Deficits / Details: strength at hip to 2+/5 with AAROM at hip to 80 flex and 15 abd    Cervical / Trunk Assessment Cervical / Trunk Assessment: Normal  Communication   Communication: No difficulties  Cognition Arousal/Alertness: Awake/alert Behavior During Therapy: WFL for tasks assessed/performed Overall Cognitive Status: Within Functional Limits for tasks assessed                                        General Comments      Exercises Total Joint Exercises Ankle Circles/Pumps: AROM;Both;20 reps;Supine Quad Sets: AROM;Both;10 reps;Supine Heel Slides: AAROM;Left;20  reps;Supine Hip ABduction/ADduction: AAROM;Left;15 reps;Supine Long Arc Quad: AROM;Left;10 reps;Seated   Assessment/Plan    PT Assessment Patient needs continued PT services  PT Problem List Decreased strength;Decreased range of motion;Decreased activity tolerance;Decreased balance;Decreased mobility;Decreased knowledge of use of DME;Pain       PT Treatment Interventions DME instruction;Gait training;Stair training;Functional mobility training;Therapeutic activities;Therapeutic exercise;Patient/family education    PT Goals  (Current goals can be found in the Care Plan section)  Acute Rehab PT Goals Patient Stated Goal: Regain IND PT Goal Formulation: With patient Time For Goal Achievement: 10/08/19 Potential to Achieve Goals: Good    Frequency 7X/week   Barriers to discharge        Co-evaluation               AM-PAC PT "6 Clicks" Mobility  Outcome Measure Help needed turning from your back to your side while in a flat bed without using bedrails?: A Little Help needed moving from lying on your back to sitting on the side of a flat bed without using bedrails?: A Little Help needed moving to and from a bed to a chair (including a wheelchair)?: A Little Help needed standing up from a chair using your arms (e.g., wheelchair or bedside chair)?: A Little Help needed to walk in hospital room?: A Little Help needed climbing 3-5 steps with a railing? : A Lot 6 Click Score: 17    End of Session Equipment Utilized During Treatment: Gait belt Activity Tolerance: Patient tolerated treatment well;Patient limited by fatigue Patient left: in chair;with call bell/phone within reach Nurse Communication: Mobility status PT Visit Diagnosis: Difficulty in walking, not elsewhere classified (R26.2)    Time: 6811-5726 PT Time Calculation (min) (ACUTE ONLY): 39 min   Charges:   PT Evaluation $PT Eval Low Complexity: 1 Low PT Treatments $Gait Training: 8-22 mins $Therapeutic Exercise: 8-22 mins        Debe Coder PT Acute Rehabilitation Services Pager [phone] Office [phone]   Bryssa Tones 09/24/2019, 12:48 PM

## 2019-09-24 NOTE — Progress Notes (Signed)
    Subjective:  Patient reports pain as mild to moderate.  Denies N/V/CP/SOB. No c/o. Eager to get up with PT  Objective:   VITALS:   Vitals:   09/23/19 2011 09/23/19 2226 09/24/19 0134 09/24/19 0633  BP: 138/67 (!) 128/51 (!) 133/54 (!) 128/58  Pulse: 91 83 72 76  Resp: 18 18 19 18   Temp: 98.6 F (37 C) 98.8 F (37.1 C) 98.7 F (37.1 C) 98.4 F (36.9 C)  TempSrc: Oral Oral Oral Oral  SpO2: 100% 98% 100% 100%  Weight:      Height:        NAD ABD soft Sensation intact distally Intact pulses distally Dorsiflexion/Plantar flexion intact Incision: dressing C/D/I Compartment soft   Lab Results  Component Value Date   WBC 10.5 09/24/2019   HGB 8.8 (L) 09/24/2019   HCT 28.8 (L) 09/24/2019   MCV 92.6 09/24/2019   PLT 265 09/24/2019   BMET    Component Value Date/Time   NA 138 09/24/2019 0244   K 4.3 09/24/2019 0244   CL 103 09/24/2019 0244   CO2 27 09/24/2019 0244   GLUCOSE 222 (H) 09/24/2019 0244   BUN 14 09/24/2019 0244   CREATININE 1.01 (H) 09/24/2019 0244   CALCIUM 8.3 (L) 09/24/2019 0244   GFRNONAA 57 (L) 09/24/2019 0244   GFRAA >60 09/24/2019 0244     Assessment/Plan: 1 Day Post-Op   Principal Problem:   Avascular necrosis of hip, left (HCC)   WBAT with walker DVT ppx: Aspirin, SCDs, TEDS PO pain control PT/OT DM2: glucose control Dispo: D/C home with HEP   11/24/2019 Blanton Kardell 09/24/2019, 8:11 AM   11/24/2019, MD [phone] [facility] is now [facility]  [place] [address], [address], [place], [place] Phone: [phone] [email or web address] Facebook  [phone]

## 2019-09-24 NOTE — Discharge Summary (Signed)
Physician Discharge Summary  Patient ID: Gina Rivas MRN: [id number] DOB/AGE: Mar 23, 1951 69 y.o.  Admit date: 09/23/2019 Discharge date: 09/24/2019  Admission Diagnoses:  Avascular necrosis of hip, left [facility])  Discharge Diagnoses:  Principal Problem:   Avascular necrosis of hip, left [facility])   Past Medical History:  Diagnosis Date  . Arthritis   . Diabetes mellitus without complication ([place])   . Gout   . Hyperlipemia   . Hypertension   . Hypothyroidism     Surgeries: Procedure(s): TOTAL HIP ARTHROPLASTY ANTERIOR APPROACH on 09/23/2019   Consultants (if any):   Discharged Condition: Improved  Hospital Course: Gina Rivas is an 69 y.o. female who was admitted 09/23/2019 with a diagnosis of Avascular necrosis of hip, left ([place]) and went to the operating room on 09/23/2019 and underwent the above named procedures.    She was given perioperative antibiotics:  Anti-infectives (From admission, onward)   Start     Dose/Rate Route Frequency Ordered Stop   09/23/19 1800  ceFAZolin (ANCEF) IVPB 2g/100 mL premix     2 g 200 mL/hr over 30 Minutes Intravenous Every 6 hours 09/23/19 1652 09/23/19 2346   09/23/19 0930  ceFAZolin (ANCEF) IVPB 2g/100 mL premix     2 g 200 mL/hr over 30 Minutes Intravenous On call to O.R. 09/23/19 5732 09/23/19 1233    .  She was given sequential compression devices, early ambulation, and ASA for DVT prophylaxis.  She benefited maximally from the hospital stay and there were no complications.    Recent vital signs:  Vitals:   09/24/19 0134 09/24/19 0633  BP: (!) 133/54 (!) 128/58  Pulse: 72 76  Resp: 19 18  Temp: 98.7 F (37.1 C) 98.4 F (36.9 C)  SpO2: 100% 100%    Recent laboratory studies:  Lab Results  Component Value Date   HGB 8.8 (L) 09/24/2019   HGB 11.0 (L) 09/18/2019   HGB 12.8 09/15/2017   Lab Results  Component Value Date   WBC 10.5 09/24/2019   PLT 265 09/24/2019   Lab Results  Component Value Date   INR 1.0  09/18/2019   Lab Results  Component Value Date   NA 138 09/24/2019   K 4.3 09/24/2019   CL 103 09/24/2019   CO2 27 09/24/2019   BUN 14 09/24/2019   CREATININE 1.01 (H) 09/24/2019   GLUCOSE 222 (H) 09/24/2019    Discharge Medications:   Allergies as of 09/24/2019      Reactions   Aspirin    GI Upset      Medication List    STOP taking these medications   aspirin EC 81 MG tablet Replaced by: aspirin 81 MG chewable tablet   indomethacin 25 MG capsule Commonly known as: INDOCIN   loperamide 2 MG capsule Commonly known as: IMODIUM     TAKE these medications   amLODipine 10 MG tablet Commonly known as: NORVASC Take 10 mg by mouth daily.   aspirin 81 MG chewable tablet Chew 1 tablet (81 mg total) by mouth 2 (two) times daily. Replaces: aspirin EC 81 MG tablet   atorvastatin 20 MG tablet Commonly known as: LIPITOR Take 20 mg by mouth at bedtime.   CALCIUM PO Take 1 tablet by mouth daily.   docusate sodium 100 MG capsule Commonly known as: COLACE Take 1 capsule (100 mg total) by mouth 2 (two) times daily.   HYDROcodone-acetaminophen 5-325 MG tablet Commonly known as: NORCO/VICODIN Take 1 tablet by mouth every 4 (four) hours as needed. What  changed: how much to take   insulin detemir 100 UNIT/ML injection Commonly known as: LEVEMIR Inject 40 Units into the skin at bedtime.   labetalol 200 MG tablet Commonly known as: NORMODYNE Take 200 mg by mouth 2 (two) times daily.   levothyroxine 125 MCG tablet Commonly known as: SYNTHROID Take 125 mcg by mouth daily before breakfast.   ondansetron 4 MG tablet Commonly known as: ZOFRAN Take 1 tablet (4 mg total) by mouth every 6 (six) hours as needed for nausea.   pioglitazone 30 MG tablet Commonly known as: ACTOS Take 30 mg by mouth daily.   Trulicity 0.75 MG/0.5ML Sopn Generic drug: Dulaglutide Inject 0.75 mg into the skin every 7 (seven) days.       Diagnostic Studies: DG Pelvis Portable  Result Date:  09/23/2019 CLINICAL DATA:  Left hip arthroplasty, questionable left superior ramus fracture EXAM: PORTABLE PELVIS 1-2 VIEWS COMPARISON:  09/23/2019 at 2:50 p.m. FINDINGS: Two frontal views of the bilateral hips are obtained. The cortical discontinuity seen within the left superior pubic ramus in the parasymphyseal location on prior study is not identified on this exam. No acute displaced fracture is visualized. Left hip arthroplasty is in the expected position without signs of acute complication. Stable right hip osteoarthritis. IMPRESSION: 1. The abnormality in the left superior ramus seen on previous study is not identified on this exam. Findings on prior study could be related to obliquity of the image. If fracture remains a concern, follow-up CT could be considered. 2. Unremarkable left hip arthroplasty. 3. Stable right hip osteoarthritis. Electronically Signed   By: Sharlet Salina M.D.   On: 09/23/2019 16:16   DG Pelvis Portable  Result Date: 09/23/2019 CLINICAL DATA:  Postop check for LEFT total hip replacement. EXAM: PORTABLE PELVIS 1-2 VIEWS COMPARISON:  09/23/2019 intraoperative exam. FINDINGS: Status post LEFT total hip arthroplasty. The femoral head component appears well seated in the acetabular component on this frontal view. There is irregularity of the LEFT symphysis pubis, raising the question of remote or acute fracture. Recommend correlation with previous exams if there are available. IMPRESSION: 1. Status post LEFT total hip arthroplasty. 2. Question of remote or acute fracture of the LEFT symphysis pubis. These results will be called to the ordering clinician or representative by the Radiologist Assistant, and communication documented in the PACS or [employer]. Electronically Signed   By: Norva Pavlov M.D.   On: 09/23/2019 15:12   DG C-Arm 1-60 Min-No Report  Result Date: 09/23/2019 Fluoroscopy was utilized by the requesting physician.  No radiographic interpretation.   DG HIP  OPERATIVE UNILAT WITH PELVIS LEFT  Result Date: 09/23/2019 CLINICAL DATA:  Total hip replacement EXAM: OPERATIVE LEFT HIP (WITH PELVIS IF PERFORMED) 1 VIEW TECHNIQUE: Fluoroscopic spot image(s) were submitted for interpretation post-operatively. FLUOROSCOPY TIME:  0 minutes 14 seconds; 1 acquired image COMPARISON:  None. FINDINGS: Frontal view obtained. Total hip replacement on the left with prosthetic components appearing well-seated. No fracture or dislocation evident. Right hip joint appears grossly normal. IMPRESSION: Total hip replacement on the left with prosthetic components appearing well-seated on frontal view. No fracture or dislocation. Electronically Signed   By: Bretta Bang III M.D.   On: 09/23/2019 14:29    Disposition: Discharge disposition: 01-Home or Self Care       Discharge Instructions    Call MD / Call 911   Complete by: As directed    If you experience chest pain or shortness of breath, CALL 911 and be transported to the  hospital emergency room.  If you develope a fever above 101 F, pus (white drainage) or increased drainage or redness at the wound, or calf pain, call your surgeon's office.   Constipation Prevention   Complete by: As directed    Drink plenty of fluids.  Prune juice may be helpful.  You may use a stool softener, such as Colace (over the counter) 100 mg twice a day.  Use MiraLax (over the counter) for constipation as needed.   Diet - low sodium heart healthy   Complete by: As directed    Driving restrictions   Complete by: As directed    No driving for 6 weeks   Increase activity slowly as tolerated   Complete by: As directed    Lifting restrictions   Complete by: As directed    No lifting for 6 weeks   TED hose   Complete by: As directed    Use stockings (TED hose) for 2 weeks on both leg(s).  You may remove them at night for sleeping.      Follow-up Information    Davona Kinoshita, Arlys John, MD. Schedule an appointment as soon as possible for a  visit in 2 weeks.   Specialty: Orthopedic Surgery Why: For wound re-check Contact information: [address] [place] [address] [place] [address] [phone]            Signed: Iline Oven Daegen Berrocal 09/24/2019, 8:20 AM

## 2019-09-29 NOTE — Anesthesia Postprocedure Evaluation (Signed)
Anesthesia Post Note  Patient: Gina Rivas  Procedure(s) Performed: TOTAL HIP ARTHROPLASTY ANTERIOR APPROACH (Left Hip)     Patient location during evaluation: PACU Anesthesia Type: Spinal Level of consciousness: awake Pain management: pain level controlled Vital Signs Assessment: post-procedure vital signs reviewed and stable Respiratory status: spontaneous breathing Cardiovascular status: stable Postop Assessment: no headache, no backache, spinal receding, patient able to bend at knees and no apparent nausea or vomiting Anesthetic complications: no    Last Vitals:  Vitals:   09/24/19 0633 09/24/19 1122  BP: (!) 128/58 (!) 129/56  Pulse: 76 67  Resp: 18 16  Temp: 36.9 C 37.1 C  SpO2: 100% 99%    Last Pain:  Vitals:   09/24/19 1325  TempSrc:   PainSc: 2    Pain Goal: Patients Stated Pain Goal: 4 (09/24/19 1325)                 Caren Macadam

## 2021-10-11 ENCOUNTER — Other Ambulatory Visit: Payer: Self-pay | Admitting: Sports Medicine

## 2021-10-11 DIAGNOSIS — M25551 Pain in right hip: Secondary | ICD-10-CM

## 2021-10-14 ENCOUNTER — Ambulatory Visit
Admission: RE | Admit: 2021-10-14 | Discharge: 2021-10-14 | Disposition: A | Discharge status: Home / Self Care | Payer: Medicare PPO | Source: Ambulatory Visit | Attending: Sports Medicine | Admitting: Sports Medicine

## 2021-10-14 DIAGNOSIS — M25551 Pain in right hip: Secondary | ICD-10-CM

## 2022-02-09 ENCOUNTER — Other Ambulatory Visit: Payer: Self-pay

## 2022-02-09 ENCOUNTER — Emergency Department (HOSPITAL_BASED_OUTPATIENT_CLINIC_OR_DEPARTMENT_OTHER)
Admission: EM | Admit: 2022-02-09 | Discharge: 2022-02-09 | Disposition: A | Discharge status: Home / Self Care | Payer: Medicare PPO | Attending: Emergency Medicine | Admitting: Emergency Medicine

## 2022-02-09 ENCOUNTER — Emergency Department (HOSPITAL_BASED_OUTPATIENT_CLINIC_OR_DEPARTMENT_OTHER): Payer: Medicare PPO

## 2022-02-09 ENCOUNTER — Encounter (HOSPITAL_BASED_OUTPATIENT_CLINIC_OR_DEPARTMENT_OTHER): Payer: Self-pay | Admitting: Emergency Medicine

## 2022-02-09 DIAGNOSIS — E119 Type 2 diabetes mellitus without complications: Secondary | ICD-10-CM | POA: Insufficient documentation

## 2022-02-09 DIAGNOSIS — I10 Essential (primary) hypertension: Secondary | ICD-10-CM | POA: Diagnosis not present

## 2022-02-09 DIAGNOSIS — H81399 Other peripheral vertigo, unspecified ear: Secondary | ICD-10-CM

## 2022-02-09 DIAGNOSIS — E039 Hypothyroidism, unspecified: Secondary | ICD-10-CM | POA: Diagnosis not present

## 2022-02-09 DIAGNOSIS — H9201 Otalgia, right ear: Secondary | ICD-10-CM | POA: Insufficient documentation

## 2022-02-09 DIAGNOSIS — R519 Headache, unspecified: Secondary | ICD-10-CM | POA: Diagnosis present

## 2022-02-09 DIAGNOSIS — R42 Dizziness and giddiness: Secondary | ICD-10-CM | POA: Diagnosis not present

## 2022-02-09 DIAGNOSIS — M542 Cervicalgia: Secondary | ICD-10-CM | POA: Insufficient documentation

## 2022-02-09 DIAGNOSIS — J0110 Acute frontal sinusitis, unspecified: Secondary | ICD-10-CM | POA: Diagnosis not present

## 2022-02-09 DIAGNOSIS — Z79899 Other long term (current) drug therapy: Secondary | ICD-10-CM | POA: Insufficient documentation

## 2022-02-09 DIAGNOSIS — Z96642 Presence of left artificial hip joint: Secondary | ICD-10-CM | POA: Diagnosis not present

## 2022-02-09 DIAGNOSIS — Z794 Long term (current) use of insulin: Secondary | ICD-10-CM | POA: Insufficient documentation

## 2022-02-09 LAB — URINALYSIS, ROUTINE W REFLEX MICROSCOPIC
Bilirubin Urine: NEGATIVE
Glucose, UA: NEGATIVE mg/dL
Hgb urine dipstick: NEGATIVE
Ketones, ur: NEGATIVE mg/dL
Leukocytes,Ua: NEGATIVE
Nitrite: NEGATIVE
Protein, ur: NEGATIVE mg/dL
Specific Gravity, Urine: 1.02 (ref 1.005–1.030)
pH: 7 (ref 5.0–8.0)

## 2022-02-09 LAB — CBC WITH DIFFERENTIAL/PLATELET
Abs Immature Granulocytes: 0.01 10*3/uL (ref 0.00–0.07)
Basophils Absolute: 0.1 10*3/uL (ref 0.0–0.1)
Basophils Relative: 1 %
Eosinophils Absolute: 0.2 10*3/uL (ref 0.0–0.5)
Eosinophils Relative: 3 %
HCT: 39.2 % (ref 36.0–46.0)
Hemoglobin: 12.8 g/dL (ref 12.0–15.0)
Immature Granulocytes: 0 %
Lymphocytes Relative: 30 %
Lymphs Abs: 1.9 10*3/uL (ref 0.7–4.0)
MCH: 30.1 pg (ref 26.0–34.0)
MCHC: 32.7 g/dL (ref 30.0–36.0)
MCV: 92.2 fL (ref 80.0–100.0)
Monocytes Absolute: 0.6 10*3/uL (ref 0.1–1.0)
Monocytes Relative: 9 %
Neutro Abs: 3.7 10*3/uL (ref 1.7–7.7)
Neutrophils Relative %: 57 %
Platelets: 292 10*3/uL (ref 150–400)
RBC: 4.25 MIL/uL (ref 3.87–5.11)
RDW: 13.1 % (ref 11.5–15.5)
WBC: 6.4 10*3/uL (ref 4.0–10.5)
nRBC: 0 % (ref 0.0–0.2)

## 2022-02-09 LAB — BASIC METABOLIC PANEL
Anion gap: 7 (ref 5–15)
BUN: 12 mg/dL (ref 8–23)
CO2: 28 mmol/L (ref 22–32)
Calcium: 8.5 mg/dL — ABNORMAL LOW (ref 8.9–10.3)
Chloride: 104 mmol/L (ref 98–111)
Creatinine, Ser: 0.83 mg/dL (ref 0.44–1.00)
GFR, Estimated: >60 mL/min (ref >60)
Glucose, Bld: 109 mg/dL — ABNORMAL HIGH (ref 70–99)
Potassium: 3.4 mmol/L — ABNORMAL LOW (ref 3.5–5.1)
Sodium: 139 mmol/L (ref 135–145)

## 2022-02-09 LAB — CBG MONITORING, ED: Glucose-Capillary: 100 mg/dL — ABNORMAL HIGH (ref 70–99)

## 2022-02-09 MED ORDER — MECLIZINE HCL 25 MG PO TABS
25.0000 mg | ORAL_TABLET | Freq: Once | ORAL | Status: AC
  Administered 2022-02-09: 25 mg via ORAL
  Filled 2022-02-09: qty 1

## 2022-02-09 MED ORDER — FLUTICASONE PROPIONATE 50 MCG/ACT NA SUSP: 1.0000 | Freq: Every day | NASAL | 0 refills | Status: AC

## 2022-02-09 MED ORDER — LORATADINE 10 MG PO TABS: 10.0000 mg | ORAL_TABLET | Freq: Every day | ORAL | 0 refills | Status: AC

## 2022-02-09 MED ORDER — SODIUM CHLORIDE 0.9 % IV BOLUS
1000.0000 mL | Freq: Once | INTRAVENOUS | Status: AC
  Administered 2022-02-09: 1000 mL via INTRAVENOUS

## 2022-02-09 MED ORDER — IOHEXOL 350 MG/ML SOLN
100.0000 mL | Freq: Once | INTRAVENOUS | Status: AC | PRN
  Administered 2022-02-09: 75 mL via INTRAVENOUS

## 2022-02-09 MED ORDER — MECLIZINE HCL 12.5 MG PO TABS: 12.5000 mg | ORAL_TABLET | Freq: Three times a day (TID) | ORAL | 0 refills | Status: AC | PRN

## 2022-02-09 NOTE — ED Triage Notes (Signed)
States that she was very dizzy this mor. ing. Had unsteady gait.Middle of neck hurting . Some blurred vision. Denies any slurred speech. Denies an y numbness or tingling in her extremities. Reports that she is still dizzy . Some some blurred vision

## 2022-02-09 NOTE — Discharge Instructions (Addendum)
It was a pleasure caring for you today in the emergency department.  Please return to the emergency department for any worsening or worrisome symptoms.   Please follow up with ENT in regards to your dizziness/vertigo if needed/if symptoms persist.

## 2022-02-09 NOTE — ED Notes (Signed)
Patient is alert x4 . Patient has a  steady gait Denies any numbness or tingling In her extremities.. States that she woke up around 4 am feeling dizziness. States that she currently has some dizziness, and blurred vision. Denies any pain in the back of her neck. Patient had strong grips . Able to lift arms and leg with no issues

## 2022-02-09 NOTE — ED Provider Notes (Signed)
[facility] EMERGENCY DEPARTMENT Provider Note   CSN: [id number] Arrival date & time: 02/09/22  1658     History  Chief Complaint  Patient presents with   Dizziness    Gina Rivas is a 71 y.o. female.  Patient as above with significant medical history as below, including DM, HLD, HTN, arthritis who presents to the ED with complaint of dizziness.  Patient reports she woke up this morning around 4 AM, she went to ambulate to the restroom, when she got back to her bed she felt a room spinning, dizziness sensation.  She laid down and went back to bed.  Symptoms improved.  She woke up again around 6 AM, she tried to get up from bed and the symptoms returned.  She felt as though the room was spinning around her, she thought she was spinning, symptoms did improve when she sat back down and lay down but worse when she turns her head or tried to ambulate again.  After a few minutes the symptoms did improve and she was able to ambulate safely.  No vomiting.  No falls or head trauma.  No numbness or tingling seems new.  Symptoms have persisted through the day, they have improved but are still persistent.  Worsened with head turning or movement.  Very sudden onset.  She did have a brief episode of neck pain around 2 hours prior to arrival, she characterizes similar to her chronic nerve pain to her neck.  Pain did resolve spontaneously.  She reports sinus pressure over the past 2 days.  Postnasal drip, slight frontal sinus pressure.  Mild tinnitus, no hearing loss  No fevers, no IV drug use, no trauma.      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 No language interpreter was used.  Dizziness Associated symptoms: headaches   Associated symptoms: no chest pain, no nausea, no palpitations and no shortness of breath        Home Medications Prior to Admission medications   Medication Sig Start Date End Date Taking? Authorizing Provider  fluticasone (FLONASE) 50 MCG/ACT nasal spray Place 1 spray into both nostrils daily for 7 days. 02/09/22 02/16/22 Yes Sloan LeiterGray, Casimir Barcellos A, DO  loratadine (CLARITIN) 10 MG tablet Take 1 tablet (10 mg total) by mouth daily for 14 days. 02/09/22 02/23/22 Yes Sloan LeiterGray, Mildred Bollard A, DO  meclizine (ANTIVERT) 12.5 MG tablet Take 1 tablet (12.5 mg total) by mouth 3 (three) times daily as needed for up to 21 days for dizziness. 02/09/22 03/02/22 Yes Tanda RockersGray, Charlee Whitebread A, DO  amLODipine (NORVASC) 10 MG tablet Take 10 mg by mouth daily. 09/10/19   [provider]  atorvastatin (LIPITOR) 20 MG tablet Take 20 mg by mouth at bedtime. 08/26/19   [provider]  CALCIUM PO Take 1 tablet by mouth daily.    [provider]  docusate sodium (COLACE) 100 MG capsule Take 1 capsule (100  mg total) by mouth 2 (two) times daily. 09/24/19   Swinteck, Arlys John, MD  Dulaglutide (TRULICITY) 0.75 MG/0.5ML SOPN Inject 0.75 mg into the skin every 7 (seven) days.    [provider]  HYDROcodone-acetaminophen (NORCO/VICODIN) 5-325 MG tablet Take 1 tablet by mouth every 4 (four) hours as needed. 09/24/19   Swinteck, Arlys John, MD  insulin detemir (LEVEMIR) 100 UNIT/ML injection Inject 40 Units into the skin at bedtime.    [provider]  labetalol (NORMODYNE) 200 MG tablet Take 200 mg by mouth 2 (two) times daily. 09/12/19   [provider]  levothyroxine (SYNTHROID, LEVOTHROID) 125 MCG tablet Take 125 mcg by mouth daily before breakfast.    [provider]   ondansetron (ZOFRAN) 4 MG tablet Take 1 tablet (4 mg total) by mouth every 6 (six) hours as needed for nausea. 09/24/19   Swinteck, Arlys John, MD  pioglitazone (ACTOS) 30 MG tablet Take 30 mg by mouth daily. 08/26/19   [provider]      Allergies    Aspirin    Review of Systems   Review of Systems  Constitutional:  Negative for activity change and fever.  HENT:  Negative for facial swelling and trouble swallowing.   Eyes:  Negative for discharge and redness.  Respiratory:  Negative for cough and shortness of breath.   Cardiovascular:  Negative for chest pain and palpitations.  Gastrointestinal:  Negative for abdominal pain and nausea.  Genitourinary:  Negative for dysuria and flank pain.  Musculoskeletal:  Positive for neck pain. Negative for back pain and gait problem.  Skin:  Negative for pallor and rash.  Neurological:  Positive for dizziness and headaches. Negative for syncope.    Physical Exam Updated Vital Signs BP (!) 168/66   Pulse 66   Temp 98.3 F (36.8 C) (Oral)   Resp 17   SpO2 96%  Physical Exam Vitals and nursing note reviewed.  Constitutional:      General: She is not in acute distress.    Appearance: Normal appearance.  HENT:     Head: Normocephalic and atraumatic. No raccoon eyes, Battle's sign, right periorbital erythema or left periorbital erythema.     Jaw: There is normal jaw occlusion. No trismus.     Right Ear: External ear normal. A middle ear effusion is present. No mastoid tenderness. Tympanic membrane is not erythematous.     Left Ear: External ear normal.  No middle ear effusion. No mastoid tenderness. Tympanic membrane is not erythematous.     Ears:     Comments: Slight middle ear effusion on the right, no erythema.    Nose: Nose normal.     Mouth/Throat:     Mouth: Mucous membranes are moist.  Eyes:     General: No scleral icterus.       Right eye: No discharge.        Left eye: No discharge.     Extraocular Movements: Extraocular  movements intact.     Pupils: Pupils are equal, round, and reactive to light.  Cardiovascular:     Rate and Rhythm: Normal rate and regular rhythm.     Pulses: Normal pulses.     Heart sounds: Normal heart sounds.  Pulmonary:     Effort: Pulmonary effort is normal. No respiratory distress.     Breath sounds: Normal breath sounds.  Abdominal:     General: Abdomen is flat.     Tenderness: There is no abdominal tenderness.  Musculoskeletal:  General: Normal range of motion.     Cervical back: Normal range of motion.     Right lower leg: No edema.     Left lower leg: No edema.  Skin:    General: Skin is warm and dry.     Capillary Refill: Capillary refill takes less than 2 seconds.  Neurological:     Mental Status: She is alert and oriented to person, place, and time.     GCS: GCS eye subscore is 4. GCS verbal subscore is 5. GCS motor subscore is 6.     Cranial Nerves: Cranial nerves 2-12 are intact. No dysarthria or facial asymmetry.     Sensory: Sensation is intact.     Motor: Motor function is intact. No weakness or tremor.     Coordination: Coordination is intact. Finger-Nose-Finger Test normal.     Gait: Gait is intact.  Psychiatric:        Mood and Affect: Mood normal.        Behavior: Behavior normal.     ED Results / Procedures / Treatments   Labs (all labs ordered are listed, but only abnormal results are displayed) Labs Reviewed  BASIC METABOLIC PANEL - Abnormal; Notable for the following components:      Result Value   Potassium 3.4 (*)    Glucose, Bld 109 (*)    Calcium 8.5 (*)    All other components within normal limits  CBG MONITORING, ED - Abnormal; Notable for the following components:   Glucose-Capillary 100 (*)    All other components within normal limits  CBC WITH DIFFERENTIAL/PLATELET  URINALYSIS, ROUTINE W REFLEX MICROSCOPIC    EKG EKG Interpretation  Date/Time:  Friday February 09 2022 17:10:16 EDT Ventricular Rate:  67 PR  Interval:  198 QRS Duration: 111 QT Interval:  423 QTC Calculation: 447 R Axis:   -64 Text Interpretation: Sinus rhythm Inferior infarct, old Interpretation limited secondary to artifact Confirmed by Tanda Rockers ([id number]) on 02/09/2022 5:44:09 PM  Radiology CT ANGIO HEAD NECK W WO CM  Result Date: 02/09/2022 CLINICAL DATA:  Dizziness, persistent/recurrent, cardiac or vascular cause suspected. Unsteady gait. Pain in middle of neck. Blurred vision. Dizziness began this morning. EXAM: CT ANGIOGRAPHY HEAD AND NECK TECHNIQUE: Multidetector CT imaging of the head and neck was performed using the standard protocol during bolus administration of intravenous contrast. Multiplanar CT image reconstructions and MIPs were obtained to evaluate the vascular anatomy. Carotid stenosis measurements (when applicable) are obtained utilizing NASCET criteria, using the distal internal carotid diameter as the denominator. RADIATION DOSE REDUCTION: This exam was performed according to the departmental dose-optimization program which includes automated exposure control, adjustment of the mA and/or kV according to patient size and/or use of iterative reconstruction technique. CONTRAST:  30mL OMNIPAQUE IOHEXOL 350 MG/ML SOLN COMPARISON:  None Available. FINDINGS: CT HEAD FINDINGS Brain: No acute infarct, hemorrhage, or mass lesion is present. The ventricles are of normal size. No significant extraaxial fluid collection is present. Basal ganglia are within normal limits. No acute or focal cortical abnormality is present. The brainstem and cerebellum are within normal limits. Vascular: Atherosclerotic calcifications are present within the cavernous internal carotid arteries bilaterally and at the dural margin of both vertebral arteries. No hyperdense vessel is present. Skull: Calvarium is intact. No focal lytic or blastic lesions are present. No significant extracranial soft tissue lesion is present. Sinuses/Orbits: The paranasal sinuses  and mastoid air cells are clear. Bilateral lens replacements are noted. Globes and orbits are otherwise unremarkable. Review  of the MIP images confirms the above findings CTA NECK FINDINGS Aortic arch: Common origin of the left common carotid artery and innominate artery is noted. Minimal calcification is present at the origin of the left subclavian artery without significant stenosis. Right carotid system: The right common carotid artery is within normal limits. Minimal calcifications present bifurcation. Cervical right ICA demonstrates mild irregularity in the mid cervical segment. No focal stenosis is present. Left carotid system: The left common carotid artery demonstrates some mural calcification without significant stenosis. Minimal atherosclerotic changes are present at the bifurcation. Mild irregularity is present in the mid cervical left ICA without significant stenosis. Vertebral arteries: The vertebral arteries are codominant. Both vertebral arteries originate from the subclavian arteries without significant stenoses. The left vertebral artery is tortuous at the left C3-4 level without entering the foramen or focal stenosis. Skeleton: Left greater than right facet disease is present. No focal osseous lesions are present. Other neck: Soft tissues the neck are otherwise unremarkable. Salivary glands are within normal limits. Thyroid is normal. No significant adenopathy is present. No focal mucosal or submucosal lesions are present. Upper chest: Mild ground-glass attenuation is present both lung apices. No nodule or mass lesion is present. No significant pleural disease is present. Review of the MIP images confirms the above findings CTA HEAD FINDINGS Anterior circulation: Mild atherosclerotic changes are present within the cavernous internal carotid arteries bilaterally without significant stenosis through the ICA termini. The A1 and M1 segments are normal. Anterior communicating artery is patent. MCA  bifurcations are intact. The ACA and MCA branch vessels are within normal limits. Posterior circulation: The vertebral arteries are codominant. PICA origins are visualized and normal. The vertebrobasilar junction and basilar artery is normal. Both posterior cerebral arteries originate from the basilar tip. The PCA branch vessels are within normal limits bilaterally. Venous sinuses: The dural sinuses are patent. The straight sinus and deep cerebral veins are intact. Cortical veins are within normal limits. No significant vascular malformation is evident. Anatomic variants: None Review of the MIP images confirms the above findings IMPRESSION: 1. Mild irregularity in the mid cervical internal carotid arteries bilaterally without significant stenosis. 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 2. No significant proximal stenosis, aneurysm, or branch vessel occlusion within the Circle of Willis. 3. Minimal atherosclerotic changes at the carotid bifurcations bilaterally without significant stenoses. 4. Normal noncontrast CT of the head. Electronically Signed   By: Marin Roberts M.D.   On: 02/09/2022 19:05    Procedures Procedures    Medications Ordered in ED Medications  meclizine (ANTIVERT) tablet 25 mg (25 mg Oral Given 02/09/22 1739)  sodium chloride 0.9 % bolus 1,000 mL (0 mLs Intravenous Stopped 02/09/22 2041)  iohexol (OMNIPAQUE) 350 MG/ML injection 100 mL (75 mLs Intravenous Contrast Given 02/09/22 1830)    ED Course/ Medical Decision Making/ A&P Clinical Course as of 02/09/22 2338  Fri Feb 09, 2022  1922 Symptoms improved [SG]    Clinical Course User Index [SG] Sloan Leiter, DO                           Medical Decision Making Amount and/or Complexity of Data Reviewed Labs: ordered. Radiology: ordered. ECG/medicine tests: ordered.  Risk OTC drugs. Prescription drug management.   This patient presents to the ED with chief complaint(s) of dizziness with pertinent past medical  history of above which further complicates the presenting complaint. The complaint involves an extensive differential diagnosis and also carries with it  a high risk of complications and morbidity.    The differential diagnosis includes but not limited to CVA, disequilibrium, Mnire's disease, vestibular neuritis, sinusitis, BPPV, posterior stroke, vascular abnormality, etc. Serious etiologies were considered.   The initial plan is to screening labs, fluids, Antivert, CT   Additional history obtained: Additional history obtained from  not applicable Records reviewed Primary Care Documents home medications, prior labs and imaging  Independent labs interpretation:  The following labs were independently interpreted: cbc stabel, bmp with mild hypokalemia; encourage ongoing po intake.   Independent visualization of imaging: - I independently visualized the following imaging with scope of interpretation limited to determining acute life threatening conditions related to emergency care: CTA head/neck, which revealed no acute abnormalities, mild irregularity to ICA, ?fibromuscular dysplasia- no acute occlusion or bleed; recommend she f/u with PCP  Cardiac monitoring was reviewed and interpreted by myself which shows NSR  Treatment and Reassessment: IV fluids, Antivert >> symptoms resolved.   Consultation: - Consulted or discussed management/test interpretation w/ external professional: na  Consideration for admission or further workup: Admission was considered   Concern for mild frontal sinusitis, bppv. D/w forward somersault, epley maneuver. F/u w/ ent   Patient presents with vertigo. On initial evaluation patient appears in no acute distress, afebrile with normal vital signs. Vertigo most suggestive of peripheral cause. Neuro intact without sign of CNS ischemia or other serious etiology. DC on Meclizine with close PCP F/U. Warnings discussed.   Patient in no distress and overall condition  improved here in the ED. Detailed discussions were had with the patient regarding current findings, and need for close f/u with PCP or on call doctor. The patient has been instructed to return immediately if the symptoms worsen in any way for re-evaluation. Patient verbalized understanding and is in agreement with current care plan. All questions answered prior to discharge.   Social Determinants of health: Social History   Tobacco Use   Smoking status: Never   Smokeless tobacco: Never  Vaping Use   Vaping Use: Never used  Substance Use Topics   Alcohol use: No   Drug use: No            Final Clinical Impression(s) / ED Diagnoses Final diagnoses:  Peripheral vertigo, unspecified laterality  Acute non-recurrent frontal sinusitis    Rx / DC Orders ED Discharge Orders          Ordered    loratadine (CLARITIN) 10 MG tablet  Daily        02/09/22 2054    fluticasone (FLONASE) 50 MCG/ACT nasal spray  Daily        02/09/22 2054    meclizine (ANTIVERT) 12.5 MG tablet  3 times daily PRN        02/09/22 2054              Jeanell Sparrow, DO 02/09/22 2339

## 2022-05-21 ENCOUNTER — Other Ambulatory Visit: Payer: Self-pay

## 2022-05-21 ENCOUNTER — Encounter (HOSPITAL_BASED_OUTPATIENT_CLINIC_OR_DEPARTMENT_OTHER): Payer: Self-pay | Admitting: Pediatrics

## 2022-05-21 ENCOUNTER — Emergency Department (HOSPITAL_BASED_OUTPATIENT_CLINIC_OR_DEPARTMENT_OTHER): Payer: Medicare PPO

## 2022-05-21 ENCOUNTER — Emergency Department (HOSPITAL_BASED_OUTPATIENT_CLINIC_OR_DEPARTMENT_OTHER)
Admission: EM | Admit: 2022-05-21 | Discharge: 2022-05-21 | Disposition: A | Discharge status: Home / Self Care | Payer: Medicare PPO | Attending: Emergency Medicine | Admitting: Emergency Medicine

## 2022-05-21 DIAGNOSIS — Z79899 Other long term (current) drug therapy: Secondary | ICD-10-CM | POA: Insufficient documentation

## 2022-05-21 DIAGNOSIS — R051 Acute cough: Secondary | ICD-10-CM | POA: Diagnosis present

## 2022-05-21 DIAGNOSIS — I10 Essential (primary) hypertension: Secondary | ICD-10-CM | POA: Diagnosis not present

## 2022-05-21 DIAGNOSIS — Z794 Long term (current) use of insulin: Secondary | ICD-10-CM | POA: Insufficient documentation

## 2022-05-21 DIAGNOSIS — J209 Acute bronchitis, unspecified: Secondary | ICD-10-CM | POA: Diagnosis not present

## 2022-05-21 DIAGNOSIS — E119 Type 2 diabetes mellitus without complications: Secondary | ICD-10-CM | POA: Diagnosis not present

## 2022-05-21 DIAGNOSIS — Z1152 Encounter for screening for COVID-19: Secondary | ICD-10-CM | POA: Insufficient documentation

## 2022-05-21 LAB — RESP PANEL BY RT-PCR (RSV, FLU A&B, COVID)  RVPGX2
Influenza A by PCR: NEGATIVE
Influenza B by PCR: NEGATIVE
Resp Syncytial Virus by PCR: NEGATIVE
SARS Coronavirus 2 by RT PCR: NEGATIVE

## 2022-05-21 MED ORDER — AZITHROMYCIN 250 MG PO TABS: ORAL_TABLET | ORAL | 0 refills | Status: AC

## 2022-05-21 NOTE — ED Triage Notes (Signed)
C/O sinus pain worst the last two days; reports cough started around Thursday with no relief from OTC flu meds.

## 2022-05-21 NOTE — Discharge Instructions (Signed)
It was our pleasure to provide your ER care today - we hope that you feel better.  Drink plenty of fluids/stay well hydrated.  Take zitrhromax (antibiotic) as prescribed. May take mucinex, nyquil or similar med for symptom relief.  Follow up with primary care doctor in 1-2 weeks if symptoms fail to improve/resolve.  Return to ER if worse, new symptoms, chest pain, increased trouble breathing, or other concern.

## 2022-05-21 NOTE — ED Notes (Signed)
Pt A&OX4 ambulatory at d/c with independent steady gait. Pt verbalized understanding of d/c instructions, prescription and follow up care. 

## 2022-05-21 NOTE — ED Provider Notes (Signed)
[facility] EMERGENCY DEPARTMENT Provider Note   CSN: [id number] Arrival date & time: 05/21/22  1022     History  Chief Complaint  Patient presents with   Cough   Nasal Congestion    Gina Rivas is a 72 y.o. female.  Pt c/o recent nasal/sinus congestion - indicates those symptoms have improved, but now has developed productive cough, greenish sputum, chest congestion. Subj fever. Denies chest pain or discomfort.  No sob. No trouble swallowing or severe throat pain. No headache. No nausea/vomiting. No abd pain. No extremity pain or swelling. Denies recent abx use. No specific known ill contacts.  The history is provided by the patient and medical records.  Cough Associated symptoms: fever   Associated symptoms: no chest pain, no headaches, no rash and no shortness of breath        Home Medications Prior to Admission medications   Medication Sig Start Date End Date Taking? Authorizing Provider  amLODipine (NORVASC) 10 MG tablet Take 10 mg by mouth daily. 09/10/19   [provider]  atorvastatin (LIPITOR) 20 MG tablet Take 20 mg by mouth at bedtime. 08/26/19   [provider]  CALCIUM PO Take 1 tablet by mouth daily.    [provider]  docusate sodium (COLACE) 100 MG capsule Take 1 capsule (100 mg total) by mouth 2 (two) times daily. 09/24/19   Swinteck, Aaron Edelman, MD  Dulaglutide (TRULICITY) 7.61 YW/7.3XT SOPN Inject 0.75 mg into the skin every 7 (seven) days.    [provider]  fluticasone (FLONASE) 50 MCG/ACT nasal spray Place 1 spray into both nostrils daily for 7 days. 02/09/22 02/16/22  Jeanell Sparrow, DO  HYDROcodone-acetaminophen (NORCO/VICODIN) 5-325 MG tablet Take 1 tablet by mouth every 4 (four) hours as needed. 09/24/19   Swinteck, Aaron Edelman, MD  insulin detemir (LEVEMIR) 100 UNIT/ML injection Inject 40 Units into the skin at bedtime.    [provider]  labetalol (NORMODYNE) 200 MG tablet Take 200 mg by mouth 2 (two) times  daily. 09/12/19   [provider]  levothyroxine (SYNTHROID, LEVOTHROID) 125 MCG tablet Take 125 mcg by mouth daily before breakfast.    [provider]  loratadine (CLARITIN) 10 MG tablet Take 1 tablet (10 mg total) by mouth daily for 14 days. 02/09/22 02/23/22  Wynona Dove A, DO  ondansetron (ZOFRAN) 4 MG tablet Take 1 tablet (4 mg total) by mouth every 6 (six) hours as needed for nausea. 09/24/19   Swinteck, Aaron Edelman, MD  pioglitazone (ACTOS) 30 MG tablet Take 30 mg by mouth daily. 08/26/19   [provider]      Allergies    Aspirin    Review of Systems   Review of Systems  Constitutional:  Positive for fever.  HENT:  Positive for congestion.   Eyes:  Negative for redness.  Respiratory:  Positive for cough. Negative for shortness of breath.   Cardiovascular:  Negative for chest pain.  Gastrointestinal:  Negative for abdominal pain, diarrhea and vomiting.  Genitourinary:  Negative for dysuria and flank pain.  Musculoskeletal:  Negative for back pain and neck pain.  Skin:  Negative for rash.  Neurological:  Negative for headaches.  Hematological:  Does not bruise/bleed easily.  Psychiatric/Behavioral:  Negative for confusion.     Physical Exam Updated Vital Signs BP (!) 177/59   Pulse 92   Temp 98.9 F (37.2 C) (Oral)   Resp 16   Ht 1.676 m (5\' 6" )   Wt 113.4 kg   SpO2  92%   BMI 40.35 kg/m  Physical Exam Vitals and nursing note reviewed.  Constitutional:      Appearance: Normal appearance. She is well-developed.  HENT:     Head: Atraumatic.     Comments: No sinus or temporal tenderness.     Right Ear: Tympanic membrane normal.     Left Ear: Tympanic membrane normal.     Nose: Congestion present.     Mouth/Throat:     Mouth: Mucous membranes are moist.  Eyes:     General: No scleral icterus.    Conjunctiva/sclera: Conjunctivae normal.     Pupils: Pupils are equal, round, and reactive to light.  Neck:     Trachea: No tracheal deviation.      Comments: No stiffness or rigidity.  Cardiovascular:     Rate and Rhythm: Normal rate and regular rhythm.     Pulses: Normal pulses.     Heart sounds: Normal heart sounds. No murmur heard.    No friction rub. No gallop.  Pulmonary:     Effort: Pulmonary effort is normal. No respiratory distress.     Comments: Coughing, upper resp congestion.  Abdominal:     General: There is no distension.     Palpations: Abdomen is soft. There is no mass.     Tenderness: There is no abdominal tenderness.  Genitourinary:    Comments: No cva tenderness.  Musculoskeletal:        General: No swelling or tenderness.     Cervical back: Normal range of motion and neck supple. No rigidity. No muscular tenderness.     Right lower leg: No edema.     Left lower leg: No edema.  Lymphadenopathy:     Cervical: No cervical adenopathy.  Skin:    General: Skin is warm and dry.     Findings: No rash.  Neurological:     Mental Status: She is alert.     Comments: Alert, speech normal.   Psychiatric:        Mood and Affect: Mood normal.     ED Results / Procedures / Treatments   Labs (all labs ordered are listed, but only abnormal results are displayed) Results for orders placed or performed during the hospital encounter of 05/21/22  Resp panel by RT-PCR (RSV, Flu A&B, Covid) Anterior Nasal Swab   Specimen: Anterior Nasal Swab  Result Value Ref Range   SARS Coronavirus 2 by RT PCR NEGATIVE NEGATIVE   Influenza A by PCR NEGATIVE NEGATIVE   Influenza B by PCR NEGATIVE NEGATIVE   Resp Syncytial Virus by PCR NEGATIVE NEGATIVE   DG Chest 2 View  Result Date: 05/21/2022 CLINICAL DATA:  Cough and congestion EXAM: CHEST - 2 VIEW COMPARISON:  None Available. FINDINGS: Haziness over both lung bases due to overlapping soft tissues. No suspicious infiltrates are identified. The heart, hila, mediastinum, lungs, and pleura are otherwise unremarkable IMPRESSION: No active cardiopulmonary disease. Electronically Signed    By: Gerome Sam III M.D.   On: 05/21/2022 10:57     EKG None  Radiology DG Chest 2 View  Result Date: 05/21/2022 CLINICAL DATA:  Cough and congestion EXAM: CHEST - 2 VIEW COMPARISON:  None Available. FINDINGS: Haziness over both lung bases due to overlapping soft tissues. No suspicious infiltrates are identified. The heart, hila, mediastinum, lungs, and pleura are otherwise unremarkable IMPRESSION: No active cardiopulmonary disease. Electronically Signed   By: Gerome Sam III M.D.   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 Labs: ordered. Decision-making details documented in ED Course. Radiology: ordered and independent interpretation performed. Decision-making details documented in ED Course.  Risk Prescription drug management. Decision regarding hospitalization.   Iv ns. Continuous pulse ox and cardiac monitoring. Labs ordered/sent. Imaging ordered.   Diff dx includes covid, flu, bronchitis, bacterial pna, etc - dispo decision including potential need for admission considered if significant pna on xr - will get labs and imaging and reassess.   Reviewed nursing notes and prior charts for additional history. External reports reviewed.   Cardiac monitor: sinus rhythm, rate 92.  Labs reviewed/interpreted by me  - covid and flu neg.   Xrays reviewed/interpreted by me - no pna.  Given prod cough, greenish sputum, chest congestion, ?bronchitis. Confirmed only allergy is asa. No recent abx. Rx zithromax.   Pt indicates has adequate of her bp meds. No headache or neuro c/o.   Pt currently appears stable for d/c.  Return precautions provided.          Final Clinical Impression(s) / ED Diagnoses Final diagnoses:  None    Rx / DC Orders ED Discharge Orders     None         Lajean Saver, MD 05/21/22 1534

## 2023-09-11 IMAGING — MR MR HIP*R* W/O CM
6 series · 37 of 40 positions shown · non-contrast
Comparison: None Available.

CLINICAL DATA: Right hip and groin pain for 2 weeks. History of
left hip replacement.

EXAM:
MR OF THE RIGHT HIP WITHOUT CONTRAST
TECHNIQUE: Multiplanar, multisequence MR imaging was performed. No intravenous
contrast was administered.

[Series 3: T1 · coronal · 4.0mm · 1.19mm/px · 8 of 24 slices shown]
[im 1/24]
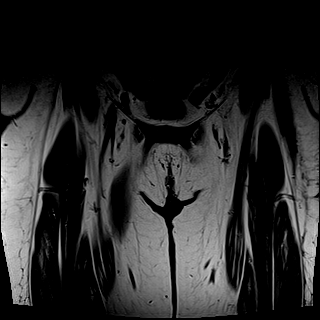
[im 4/24]
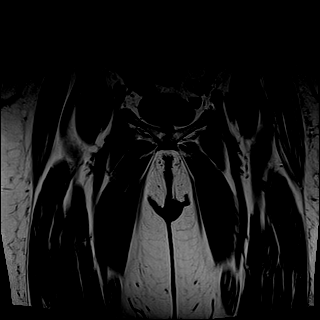
[im 7/24]
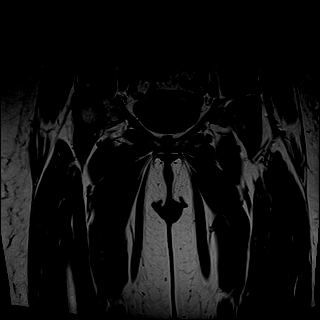
[im 10/24]
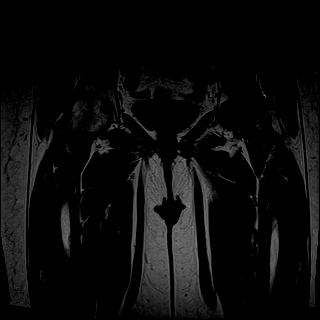
[im 14/24]
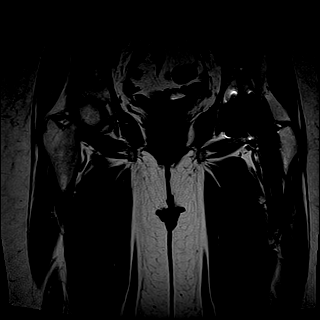
[im 17/24]
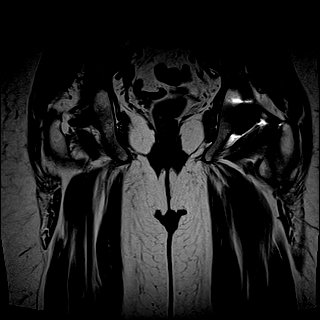
[im 20/24]
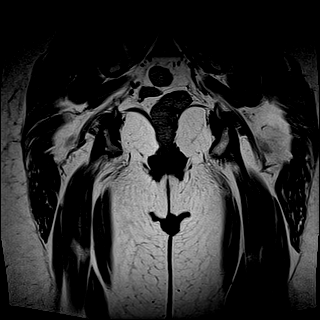
[im 24/24]
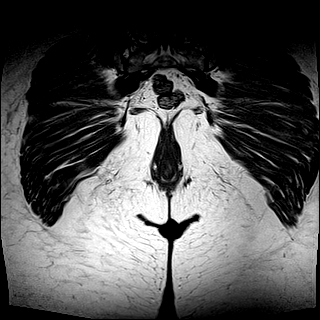

[Series 4: T2 fat-sat · coronal · 4.0mm · 1.19mm/px · 7 of 24 slices shown (1 of 2)]
[im 1/24]
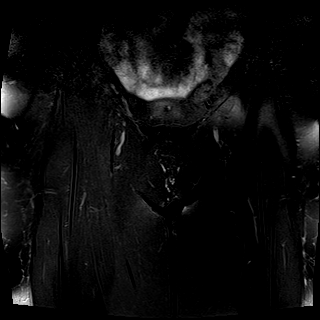
[im 4/24]
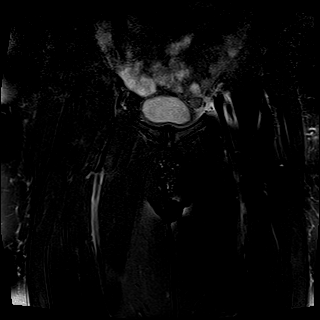
[im 8/24]
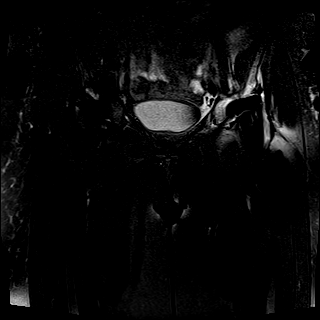
[im 12/24]
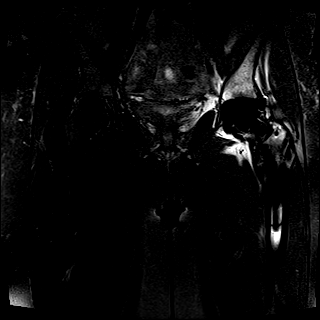
[im 16/24]
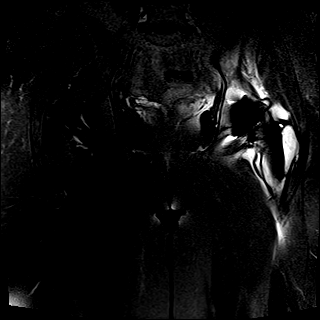
[im 20/24]
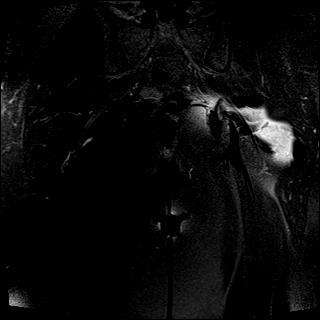
[im 24/24]
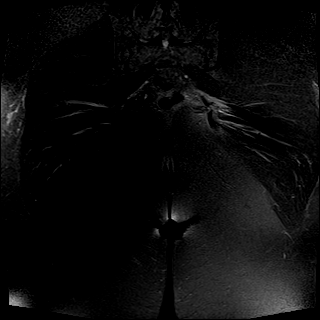

[Series 5: T2 fat-sat · axial · 4.0mm · 0.62mm/px · z∈[-3,+117]mm · 7 of 26 slices shown (2 of 2)]
[im 1/26]
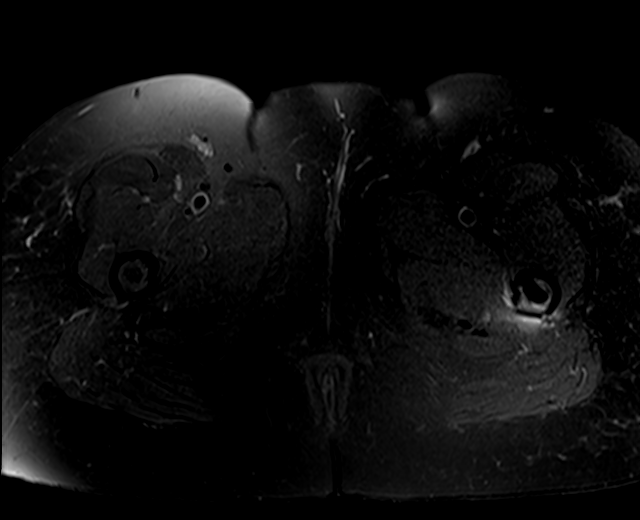
[im 5/26]
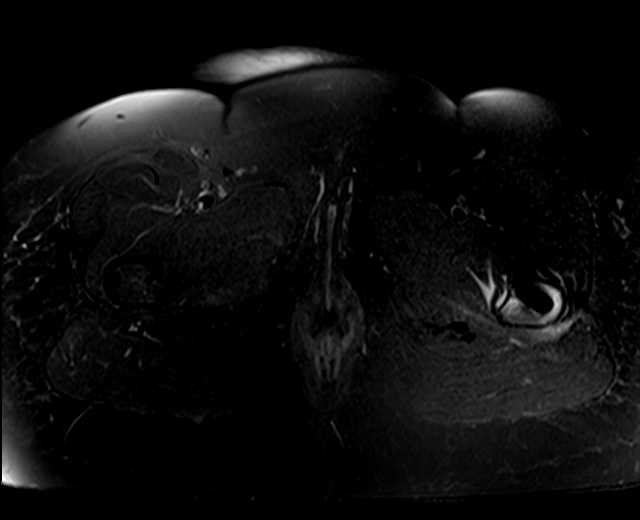
[im 9/26]
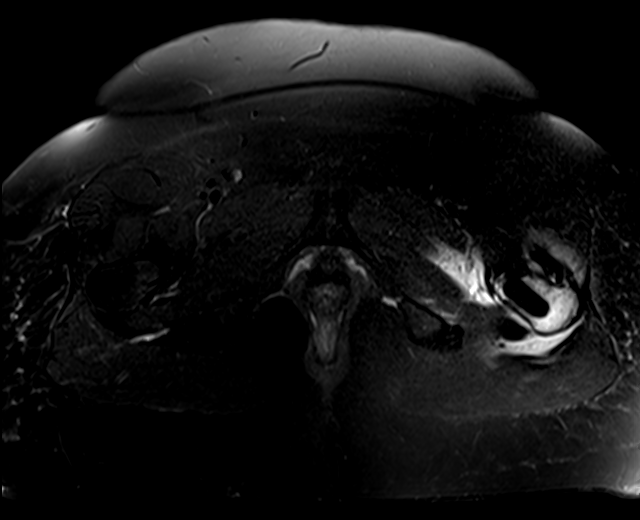
[im 13/26]
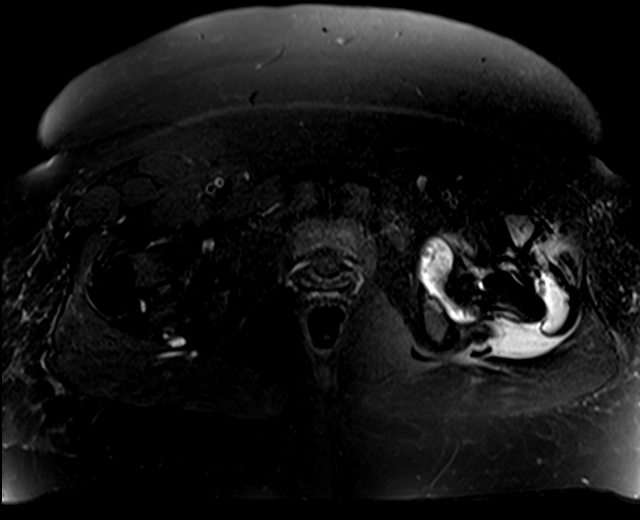
[im 17/26]
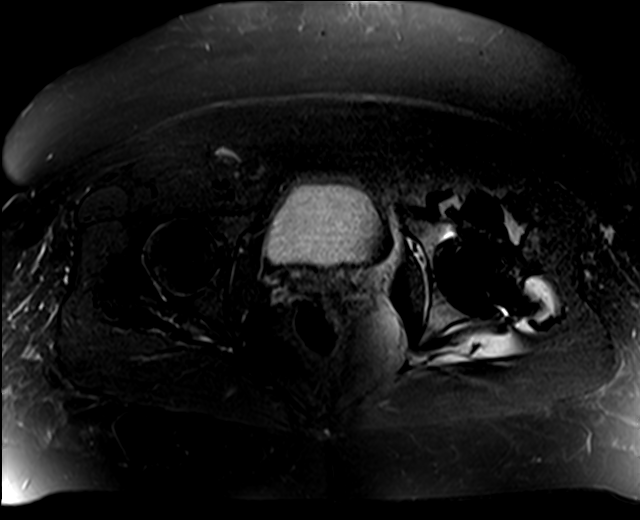
[im 21/26]
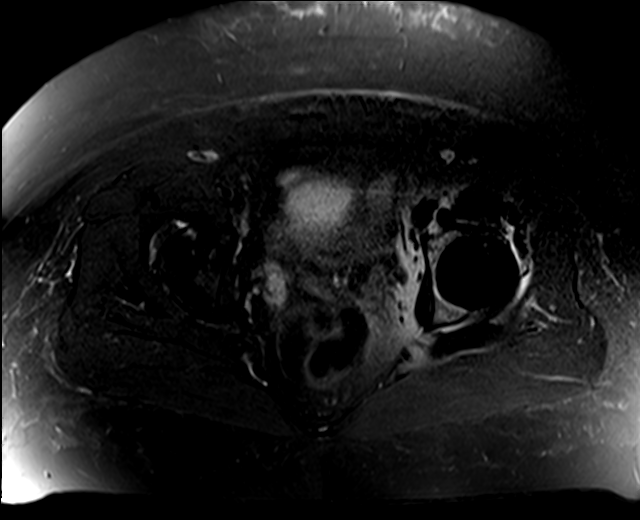
[im 26/26]
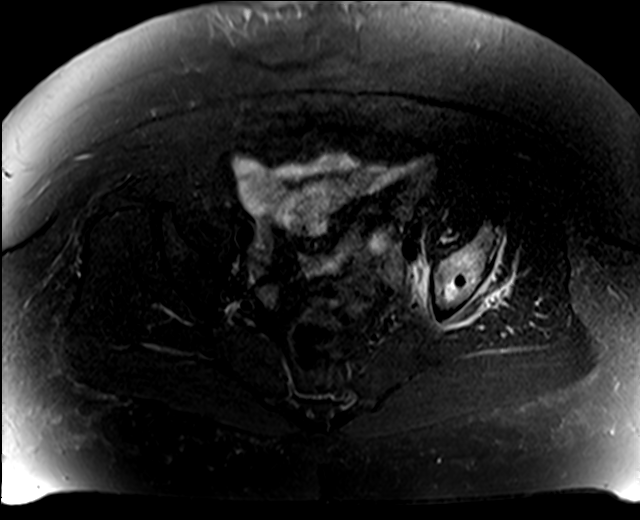

[Series 6: PD fat-sat · sagittal · 4.0mm · 0.70mm/px · 8 of 27 slices shown (1 of 3)]
[im 1/27]
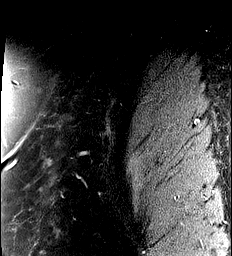
[im 4/27]
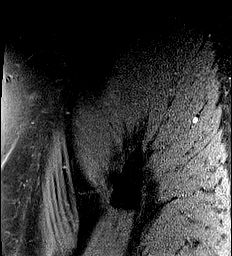
[im 8/27]
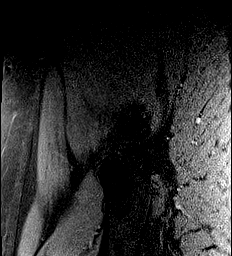
[im 12/27]
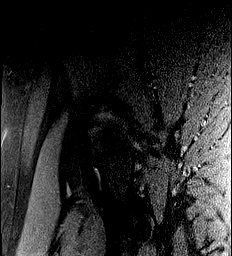
[im 15/27]
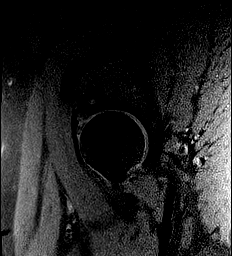
[im 19/27]
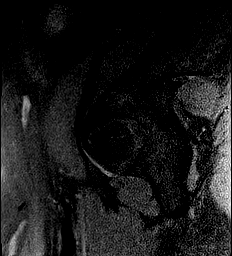
[im 23/27]
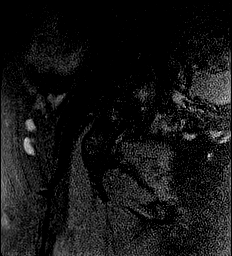
[im 27/27]
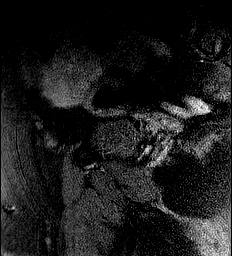

[Series 7: PD fat-sat · coronal · 4.0mm · 0.70mm/px · 5 of 19 slices shown (2 of 3)]
[im 1/19]
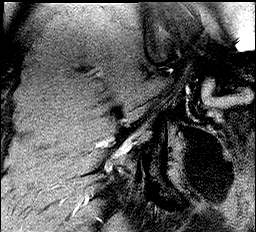
[im 5/19]
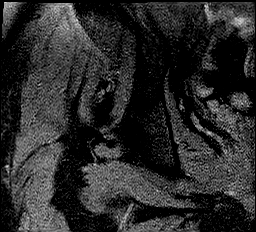
[im 10/19]
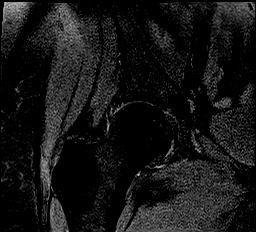
[im 14/19]
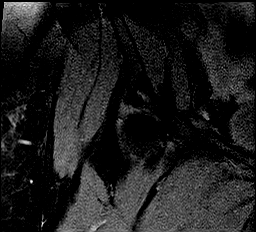
[im 19/19]
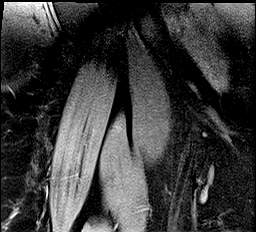

[Series 8: PD fat-sat · coronal · 4.0mm · 0.94mm/px · 2 of 19 slices shown (3 of 3)]
[im 1/19]
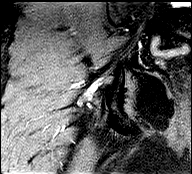
[im 5/19]
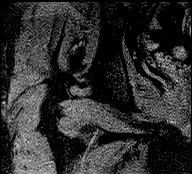

[37 of 40 positions shown; findings below may reference images not displayed]

FINDINGS: Bones: There is metallic susceptibility artifact from total left hip
arthroplasty that cause inhomogeneous fat saturation and obscures
portions of the proximal left femur and left hemipelvis.

Moderate pubic symphysis joint space narrowing and peripheral
osteophytosis.

No acute fracture or avascular necrosis within the visualized pelvis
or either proximal femur.

Right hip:

Articular cartilage and labrum

Articular cartilage: High-grade partial and full-thickness cartilage
loss within the anterior superior quadrant of the right femoral head
and acetabulum. Moderate thinning of the posterosuperior quadrant.
No right hip joint effusion. Mild right femoral head-neck junction
circumferential degenerative osteophytes. Moderate superolateral
right acetabular degenerative osteophytosis.

Labrum: There is degenerate irregularity and tearing of the anterior
superior right acetabular labrum.

Joint or bursal effusion

Joint effusion:  No right hip joint effusion.

Bursae: No trochanteric bursitis on either side.

No large left hip joint effusion is seen outside of the metallic
artifact.

Muscles and tendons

Muscles and tendons: The origins of the bilateral rectus femoris
tendons and the bilateral common hamstring tendon origins are
intact. The insertions of the bilateral gluteus minimus gluteus
medius, and iliopsoas tendons are intact. The rectus
abdominis-adductor aponeuroses are intact.

Other findings

Miscellaneous: Moderate to severe L5-S1 degenerative disc changes.
The uterus appears to be surgically absent.
IMPRESSION: 1. Status post total left hip arthroplasty without gross abnormality
seen.
2. Superior right femoroacetabular osteoarthritis. No evidence of
avascular necrosis.
